# Patient Record
Sex: Male | Born: 1970
Health system: Midwestern US, Community
[De-identification: ages and names within clinical notes are randomized; demographics above are authoritative.]

## PROBLEM LIST (undated history)

## (undated) DIAGNOSIS — F319 Bipolar disorder, unspecified: Secondary | ICD-10-CM

## (undated) DIAGNOSIS — F209 Schizophrenia, unspecified: Secondary | ICD-10-CM

## (undated) DIAGNOSIS — F329 Major depressive disorder, single episode, unspecified: Secondary | ICD-10-CM

## (undated) DIAGNOSIS — I10 Essential (primary) hypertension: Secondary | ICD-10-CM

## (undated) DIAGNOSIS — E7800 Pure hypercholesterolemia, unspecified: Secondary | ICD-10-CM

## (undated) DIAGNOSIS — F32A Depression, unspecified: Secondary | ICD-10-CM

## (undated) DIAGNOSIS — E785 Hyperlipidemia, unspecified: Secondary | ICD-10-CM

## (undated) HISTORY — PX: NO PAST SURGERIES: SHX2092

## (undated) MED FILL — CLONIDINE HCL 0.1MG TABS: 0.1 MG | 7 days supply | Qty: 14 | Fill #0 | Status: AC

## (undated) MED FILL — traZODone 50MG TAB: 50 MG | 14 days supply | Qty: 14 | Fill #0 | Status: AC

---

## 2017-06-05 ENCOUNTER — Emergency Department (HOSPITAL_COMMUNITY)
Admission: EM | Admit: 2017-06-05 | Discharge: 2017-06-06 | Disposition: A | Discharge status: Home / Self Care | Payer: Self-pay | Attending: Emergency Medicine | Admitting: Emergency Medicine

## 2017-06-05 ENCOUNTER — Other Ambulatory Visit: Payer: Self-pay

## 2017-06-05 ENCOUNTER — Encounter (HOSPITAL_COMMUNITY): Payer: Self-pay | Admitting: Emergency Medicine

## 2017-06-05 DIAGNOSIS — F1414 Cocaine abuse with cocaine-induced mood disorder: Secondary | ICD-10-CM | POA: Diagnosis present

## 2017-06-05 DIAGNOSIS — R45851 Suicidal ideations: Secondary | ICD-10-CM | POA: Insufficient documentation

## 2017-06-05 DIAGNOSIS — F1910 Other psychoactive substance abuse, uncomplicated: Secondary | ICD-10-CM | POA: Insufficient documentation

## 2017-06-05 LAB — CBC
HEMATOCRIT: 40.1 % (ref 39.0–52.0)
Hemoglobin: 13.7 g/dL (ref 13.0–17.0)
MCH: 30.2 pg (ref 26.0–34.0)
MCHC: 34.2 g/dL (ref 30.0–36.0)
MCV: 88.5 fL (ref 78.0–100.0)
Platelets: 202 10*3/uL (ref 150–400)
RBC: 4.53 MIL/uL (ref 4.22–5.81)
RDW: 12.7 % (ref 11.5–15.5)
WBC: 7.7 10*3/uL (ref 4.0–10.5)

## 2017-06-05 LAB — COMPREHENSIVE METABOLIC PANEL
ALBUMIN: 4.1 g/dL (ref 3.5–5.0)
ALK PHOS: 79 U/L (ref 38–126)
ALT: 34 U/L (ref 17–63)
ANION GAP: 15 (ref 5–15)
AST: 35 U/L (ref 15–41)
BUN: 12 mg/dL (ref 6–20)
CALCIUM: 9.2 mg/dL (ref 8.9–10.3)
CO2: 23 mmol/L (ref 22–32)
Chloride: 105 mmol/L (ref 101–111)
Creatinine, Ser: 1.09 mg/dL (ref 0.61–1.24)
GFR calc Af Amer: >60 mL/min (ref >60)
GFR calc non Af Amer: >60 mL/min (ref >60)
GLUCOSE: 103 mg/dL — AB (ref 65–99)
Potassium: 3.8 mmol/L (ref 3.5–5.1)
SODIUM: 143 mmol/L (ref 135–145)
Total Bilirubin: 0.7 mg/dL (ref 0.3–1.2)
Total Protein: 7.9 g/dL (ref 6.5–8.1)

## 2017-06-05 LAB — ETHANOL: Alcohol, Ethyl (B): 37 mg/dL — ABNORMAL HIGH (ref <10)

## 2017-06-05 LAB — RAPID URINE DRUG SCREEN, HOSP PERFORMED
Amphetamines: NOT DETECTED
BARBITURATES: NOT DETECTED
BENZODIAZEPINES: NOT DETECTED
COCAINE: POSITIVE — AB
Opiates: NOT DETECTED
TETRAHYDROCANNABINOL: NOT DETECTED

## 2017-06-05 LAB — SALICYLATE LEVEL: Salicylate Lvl: <7 mg/dL (ref 2.8–30.0)

## 2017-06-05 LAB — ACETAMINOPHEN LEVEL

## 2017-06-05 MED ORDER — ZOLPIDEM TARTRATE 5 MG PO TABS: 5.0000 mg | ORAL_TABLET | Freq: Every evening | ORAL | Status: DC | PRN

## 2017-06-05 MED ORDER — LORAZEPAM 1 MG PO TABS: 0.0000 mg | ORAL_TABLET | Freq: Two times a day (BID) | ORAL | Status: DC

## 2017-06-05 MED ORDER — SERTRALINE HCL 50 MG PO TABS
100.0000 mg | ORAL_TABLET | Freq: Every day | ORAL | Status: DC
  Administered 2017-06-05: 100 mg via ORAL
  Filled 2017-06-05: qty 2

## 2017-06-05 MED ORDER — THIAMINE HCL 100 MG/ML IJ SOLN: 100.0000 mg | Freq: Every day | INTRAMUSCULAR | Status: DC

## 2017-06-05 MED ORDER — LORAZEPAM 2 MG/ML IJ SOLN: 0.0000 mg | Freq: Two times a day (BID) | INTRAMUSCULAR | Status: DC

## 2017-06-05 MED ORDER — HYDROXYZINE HCL 25 MG PO TABS: 50.0000 mg | ORAL_TABLET | Freq: Three times a day (TID) | ORAL | Status: DC | PRN

## 2017-06-05 MED ORDER — ACETAMINOPHEN 325 MG PO TABS: 650.0000 mg | ORAL_TABLET | ORAL | Status: DC | PRN

## 2017-06-05 MED ORDER — VITAMIN B-1 100 MG PO TABS
100.0000 mg | ORAL_TABLET | Freq: Every day | ORAL | Status: DC
  Administered 2017-06-05 – 2017-06-06 (×2): 100 mg via ORAL
  Filled 2017-06-05 (×2): qty 1

## 2017-06-05 MED ORDER — LORAZEPAM 2 MG/ML IJ SOLN: 0.0000 mg | Freq: Four times a day (QID) | INTRAMUSCULAR | Status: DC

## 2017-06-05 MED ORDER — LORAZEPAM 1 MG PO TABS
0.0000 mg | ORAL_TABLET | Freq: Four times a day (QID) | ORAL | Status: DC
  Administered 2017-06-05: 2 mg via ORAL
  Filled 2017-06-05: qty 2

## 2017-06-05 NOTE — ED Notes (Signed)
Patient endorse SI with out a plan and AH telling him people are out to get him. Patient denies HI and VH. Plan of care discussed. Encouragement and support provided and safety maintain. Q 15 min safety checks in place and video monitoring.

## 2017-06-05 NOTE — ED Triage Notes (Signed)
Pt reports feeling suicidal without a plan and has been taking narcotics daily for the last week and feels unsafe.

## 2017-06-05 NOTE — BH Assessment (Addendum)
Tele Assessment Note   Patient Name: Craig Hogan MRN: [id number] Referring Physician: Delbert Phenix, MD Location of Patient: [facility] ED, [facility] Location of Provider: Behavioral Health TTS Department  [name] is an 47 y.o. single male who presents unaccompanied to [facility] reporting symptoms of depression, psychosis and substance abuse. Pt reports he has a history of depression and was released from jail in [place] after being incarcerated for three days due to missing child support payments. Pt says he came to [facility] and was in distress so he contacted law enforcement who referred Pt to [facility]. Pt report he relapsed on alcohol and cocaine. ED notes state Pt reported he is abusing narcotics but Pt reports he is only using alcohol and cocaine; Pt's urine drug screen is positive for cocaine and blood alcohol level is 37. He says he is paranoid and believes he is hearing people's thoughts in his head. He also says "everywhere I go people know me and are watching me." He says the voices sometimes tell him people are trying to harm him. He denies visual hallucinations. He reports current suicidal ideation with no plan and no intent. Pt denies any history of suicide attempts. He denies any history of intentional self-injurious behaviors. He reports thoughts of harming others with no plan, victim or intent. He says these thoughts of harming others are in response to believe people want to harm him. Pt acknowledges symptoms including social withdrawal, fatigue, irritability, decreased concentration, decreased sleep and feelings of guilt and hopelessness.   Pt identifies his mental health symptoms as his primary stressor. He says he has been unable to secure employment and he has financial stress. He reports he is currently homeless. He identifies his mother as his only support. Pt reports he has a seventeen-year-old daughter who is in the care of her mother. Pt says he has a court  date in two months for back child support. He denies any history of abuse or trauma.  Pt reports he has been psychiatrically hospitalized twice before. He says he was last psychiatrically hospitalized approximately six weeks ago at [facility]. He says he is prescribed hydroxyzine and Zoloft at [facility] and he currently does not have an outpatient provider.  Pt is dressed in hospital scrubs, alert and oriented x4. Pt speaks in a clear tone, at moderate volume and normal pace. Motor behavior appears normal. Eye contact is good. Pt's mood is depressed and anxious; affect is anxious. Thought process is coherent and relevant. Pt was polite and cooperative throughout assessment. He says he is willing to sign voluntarily into a psychiatric facility.   Diagnosis:  F33.3 Major Depressive Disorder, Recurrent, Severe With Psychotic Features F10.20 Alcohol Use Disorder F14.20 Cocaine Use Disorder  Past Medical History: History reviewed. No pertinent past medical history.  History reviewed. No pertinent surgical history.  Family History: History reviewed. No pertinent family history.  Social History:  reports that he has been smoking cigarettes.  He has been smoking about 0.50 packs per day. He has never used smokeless tobacco. He reports that he drinks alcohol. He reports that he has current or past drug history. Drug: Cocaine.  Additional Social History:  Alcohol / Drug Use Pain Medications: Pt denies use Prescriptions: See MAR Over the Counter: See MAR History of alcohol / drug use?: Yes Longest period of sobriety (when/how long): Six months Negative Consequences of Use: Personal relationships, Financial, Work / School Withdrawal Symptoms: (Pt denies) Substance #1 Name of Substance 1: Alcohol 1 -  Age of First Use: Adolescent 1 - Amount (size/oz): 6-12 cans of beer 1 - Frequency: Daily 1 - Duration: Ongoing for years 1 - Last Use / Amount: 06/05/17 Substance #2 Name of Substance 2:  Cocaine (crack) 2 - Age of First Use: 43 2 - Amount (size/oz): Approximately 1 gram 2 - Frequency: Daily when available 2 - Duration: Four years 2 - Last Use / Amount: 06/05/17  CIWA: CIWA-Ar BP: 135/80 Pulse Rate: 81 Nausea and Vomiting: no nausea and no vomiting Tactile Disturbances: mild itching, pins and needles, burning or numbness Tremor: two Auditory Disturbances: very mild harshness or ability to frighten Paroxysmal Sweats: barely perceptible sweating, palms moist Visual Disturbances: very mild sensitivity Anxiety: two Headache, Fullness in Head: none present Agitation: somewhat more than normal activity Orientation and Clouding of Sensorium: cannot do serial additions or is uncertain about date CIWA-Ar Total: 11 COWS:    Allergies: No Known Allergies  Home Medications:  (Not in a hospital admission)  OB/GYN Status:  No LMP for male patient.  General Assessment Data [facility] of [facility]: [facility] TTS Assessment: In system Is this a Tele or Face-to-Face Assessment?: Tele Assessment Is this an Initial Assessment or a Re-assessment for this encounter?: Initial Assessment Marital status: Single Maiden name: NA Is patient pregnant?: No Pregnancy Status: No Living Arrangements: Other (Comment)(Homeless) Can pt return to current living arrangement?: Yes Admission Status: Voluntary Is patient capable of signing voluntary admission?: Yes Referral Source: Self/Family/Friend Insurance type: Self-pay     Crisis Care Plan Living Arrangements: Other (Comment)(Homeless) Legal Guardian: Other:(Self) Name of Psychiatrist: None Name of Therapist: None  Education Status Is patient currently in school?: No Is the patient employed, unemployed or receiving disability?: Unemployed  Risk to self with the past 6 months Suicidal Ideation: Yes-Currently Present Has patient been a risk to self within the past 6 months prior to admission? : Yes Suicidal Intent: No Has patient  had any suicidal intent within the past 6 months prior to admission? : No Is patient at risk for suicide?: Yes Suicidal Plan?: No Has patient had any suicidal plan within the past 6 months prior to admission? : No Access to Means: No What has been your use of drugs/alcohol within the last 12 months?: Pt reports using alcohol and crack daily when available Previous Attempts/Gestures: No How many times?: 0 Other Self Harm Risks: None Triggers for Past Attempts: None known Intentional Self Injurious Behavior: None Family Suicide History: No Recent stressful life event(s): Legal Issues, Financial Problems, Job Loss, Other (Comment)(Homeless) Persecutory voices/beliefs?: Yes Depression: Yes Depression Symptoms: Despondent, Insomnia, Isolating, Fatigue, Guilt, Loss of interest in usual pleasures, Feeling angry/irritable Substance abuse history and/or treatment for substance abuse?: Yes Suicide prevention information given to non-admitted patients: Not applicable  Risk to Others within the past 6 months Homicidal Ideation: Yes-Currently Present Does patient have any lifetime risk of violence toward others beyond the six months prior to admission? : No Thoughts of Harm to Others: Yes-Currently Present Comment - Thoughts of Harm to Others: Pt reports vague thoughts of harming unspecified people Current Homicidal Intent: No Current Homicidal Plan: No Access to Homicidal Means: No Identified Victim: No specified victim History of harm to others?: No Assessment of Violence: None Noted Violent Behavior Description: Pt denies history of violence Does patient have access to weapons?: No Criminal Charges Pending?: No Does patient have a court date: Yes Court Date: 08/05/17 Is patient on probation?: No  Psychosis Hallucinations: Auditory(Pt reports hearing other people's thoughts) Delusions: Persecutory(Pt  believes people know him and are watching him)  Mental Status  Report Appearance/Hygiene: In scrubs Eye Contact: Good Motor Activity: Unremarkable, Freedom of movement Speech: Logical/coherent Level of Consciousness: Alert Mood: Depressed, Anxious Affect: Anxious Anxiety Level: Moderate Thought Processes: Coherent, Relevant Judgement: Impaired Orientation: Person, Place, Time, Situation, Appropriate for developmental age Obsessive Compulsive Thoughts/Behaviors: None  Cognitive Functioning Concentration: Fair Memory: Recent Intact, Remote Intact Is patient IDD: No Is patient DD?: No Insight: Fair Impulse Control: Fair Appetite: Good Have you had any weight changes? : No Change Sleep: Decreased Total Hours of Sleep: 6 Vegetative Symptoms: None  ADLScreening [facility] Assessment Services) Patient's cognitive ability adequate to safely complete daily activities?: Yes Patient able to express need for assistance with ADLs?: Yes Independently performs ADLs?: Yes (appropriate for developmental age)  Prior Inpatient Therapy Prior Inpatient Therapy: Yes Prior Therapy Dates: 03/2017 [facility] Reason for Treatment: Psychosis and substance abuse  Prior Outpatient Therapy Prior Outpatient Therapy: No Does patient have an ACCT team?: No Does patient have Intensive In-House Services?  : No Does patient have [facility] services? : No Does patient have P4CC services?: No  ADL Screening (condition at time of admission) Patient's cognitive ability adequate to safely complete daily activities?: Yes Is the patient deaf or have difficulty hearing?: No Does the patient have difficulty seeing, even when wearing glasses/contacts?: No Does the patient have difficulty concentrating, remembering, or making decisions?: No Patient able to express need for assistance with ADLs?: Yes Does the patient have difficulty dressing or bathing?: No Independently performs ADLs?: Yes (appropriate for developmental age) Does the patient  have difficulty walking or climbing stairs?: No Weakness of Legs: None Weakness of Arms/Hands: None  Home Assistive Devices/Equipment Home Assistive Devices/Equipment: None    Abuse/Neglect Assessment (Assessment to be complete while patient is alone) Abuse/Neglect Assessment Can Be Completed: Yes Physical Abuse: Denies Verbal Abuse: Denies Sexual Abuse: Denies Exploitation of patient/patient's resources: Denies Self-Neglect: Denies     Merchant navy officerAdvance Directives (For Healthcare) Does Patient Have a Medical Advance Directive?: No Would patient like information on creating a medical advance directive?: No - Patient declined          Disposition: Gave clinical report to Nira ConnJason Berry, NP who recommended Pt be observed overnight for safety and stabilization and evaluated by psychiatry in the morning. Notified Dr. Delbert PhenixMartha Mabe and Tyrell Antonioashell McLean, RN of recommendation.  Disposition Initial Assessment Completed for this Encounter: Yes Patient referred to: Other (Comment)  This service was provided via telemedicine using a 2-way, interactive audio and video technology.  Names of all persons participating in this telemedicine service and their role in this encounter. Name: Dyanne Iha'Angelo Sanko Role: Patient  Name: Shela CommonsFord Jaiceon Collister Jr, [place]PC Role: TTS counselor         Harlin RainFord Ellis Patsy BaltimoreWarrick Jr, [facility], [facility], [facility] Triage Specialist [phone]  Pamalee LeydenWarrick Jr, Starlyn Droge Ellis 06/05/2017 10:51 PM

## 2017-06-05 NOTE — ED Provider Notes (Signed)
[facility]-EMERGENCY DEPT Provider Note   CSN: [id number] Arrival date & time: 06/05/17  2033     History   Chief Complaint Chief Complaint  Patient presents with  . Suicidal    HPI Craig Hogan is a 47 y.o. male.  HPI  Patient presents with complaint of feeling suicidal.  He is vague when talking about this but states he has been "going through a lot of things".  He states he has been feeling suicidal for the past 2 months.   He does not have a specific plan.  He does state that he feels unsafe.  He states he has been taking narcotics and also states he used cocaine earlier today.  No recent fever, cough, abdominal pain or vomtiing.  There are no other associated systemic symptoms, there are no other alleviating or modifying factors.   History reviewed. No pertinent past medical history.  There are no active problems to display for this patient.   History reviewed. No pertinent surgical history.      Home Medications    Prior to Admission medications   Medication Sig Start Date End Date Taking? Authorizing Provider  hydrOXYzine (ATARAX/VISTARIL) 50 MG tablet Take 50 mg by mouth every 8 (eight) hours as needed for anxiety.   Yes [provider]  Multiple Vitamin (MULTIVITAMIN WITH MINERALS) TABS tablet Take 1 tablet by mouth daily.   Yes [provider]  sertraline (ZOLOFT) 100 MG tablet Take 100 mg by mouth at bedtime.   Yes [provider]    Family History History reviewed. No pertinent family history.  Social History Social History   Tobacco Use  . Smoking status: Current Every Day Smoker    Packs/day: 0.50    Types: Cigarettes  . Smokeless tobacco: Never Used  Substance Use Topics  . Alcohol use: Yes    Comment: 6-12 pack a day  . Drug use: Yes    Types: Cocaine     Allergies   Patient has no known allergies.   Review of Systems Review of Systems  ROS reviewed and all otherwise negative except  for mentioned in HPI   Physical Exam Updated Vital Signs BP 135/80   Pulse 81   Temp 99.2 F (37.3 C)   Resp 18   Ht 6\' 4"  (1.93 m)   Wt 111.1 kg (245 lb)   SpO2 97%   BMI 29.82 kg/m  Vitals reviewed Physical Exam  Physical Examination: General appearance - alert, well appearing, and in no distress Mental status - alert, oriented to person, place, and time Eyes - no conjunctival injection, no scleral icterus Chest - clear to auscultation, no wheezes, rales or rhonchi, symmetric air entry Heart - normal rate, regular rhythm, normal S1, S2, no murmurs, rubs, clicks or gallops Abdomen - soft, nontender, nondistended, no masses or organomegaly Neurological - alert, oriented, normal speech Extremities - peripheral pulses normal, no pedal edema, no clubbing or cyanosis Skin - normal coloration and turgor, no rashes Psych- flat affect, calm and cooperative   ED Treatments / Results  Labs (all labs ordered are listed, but only abnormal results are displayed) Labs Reviewed  COMPREHENSIVE METABOLIC PANEL - Abnormal; Notable for the following components:      Result Value   Glucose, Bld 103 (*)    All other components within normal limits  ETHANOL - Abnormal; Notable for the following components:   Alcohol, Ethyl (B) 37 (*)    All other components within normal limits  ACETAMINOPHEN LEVEL - Abnormal; Notable for the following components:   Acetaminophen (Tylenol), Serum <10 (*)    All other components within normal limits  RAPID URINE DRUG SCREEN, HOSP PERFORMED - Abnormal; Notable for the following components:   Cocaine POSITIVE (*)    All other components within normal limits  SALICYLATE LEVEL  CBC    EKG None  ED ECG REPORT   Date: 06/05/2017  Rate: 80  Rhythm: normal sinus rhythm  QRS Axis: normal  Intervals: normal  ST/T Wave abnormalities: normal  Conduction Disutrbances:none  Narrative Interpretation:   Old EKG Reviewed: none available  EKG link not  available in epic for interpetation in muse   Radiology No results found.  Procedures Procedures (including critical care time)  Medications Ordered in ED Medications  LORazepam (ATIVAN) injection 0-4 mg ( Intravenous See Alternative 06/05/17 2207)    Or  LORazepam (ATIVAN) tablet 0-4 mg (2 mg Oral Given 06/05/17 2207)  LORazepam (ATIVAN) injection 0-4 mg (has no administration in time range)    Or  LORazepam (ATIVAN) tablet 0-4 mg (has no administration in time range)  thiamine (VITAMIN B-1) tablet 100 mg (100 mg Oral Given 06/05/17 2207)    Or  thiamine (B-1) injection 100 mg ( Intravenous See Alternative 06/05/17 2207)  acetaminophen (TYLENOL) tablet 650 mg (has no administration in time range)  zolpidem (AMBIEN) tablet 5 mg (has no administration in time range)  hydrOXYzine (ATARAX/VISTARIL) tablet 50 mg (has no administration in time range)  sertraline (ZOLOFT) tablet 100 mg (100 mg Oral Given 06/05/17 2311)     Initial Impression / Assessment and Plan / ED Course  I have reviewed the triage vital signs and the nursing notes.  Pertinent labs & imaging results that were available during my care of the patient were reviewed by me and considered in my medical decision making (see chart for details).    10:46 PM pt has been evaluated by TTS and they plan for overnight observation with re-evaluation in the morning.    pysch holding orders have been written and home meds written.    Final Clinical Impressions(s) / ED Diagnoses   Final diagnoses:  Suicidal ideation  Polysubstance abuse [facility])    ED Discharge Orders    None       Phillis Haggis, MD 06/05/17 2335

## 2017-06-05 NOTE — ED Notes (Signed)
TTS in progress 

## 2017-06-05 NOTE — ED Notes (Signed)
Bed: [facility] Expected date:  Expected time:  Means of arrival:  Comments: 

## 2017-06-06 DIAGNOSIS — F1414 Cocaine abuse with cocaine-induced mood disorder: Secondary | ICD-10-CM

## 2017-06-06 DIAGNOSIS — R45851 Suicidal ideations: Secondary | ICD-10-CM

## 2017-06-06 NOTE — ED Notes (Signed)
AVS, and prescription provided and reviewed. Verbalized understanding of DC instructions and prescriptions. Denied SI/HI/AVH. Denied pain. Offered no questions or concerns. Escorted off the unit, belongings returned and directed to front exit by MHT. Pt left in no acute distress 

## 2017-06-06 NOTE — BH Assessment (Signed)
[facility] Assessment Progress Note     Per Dr. Jannifer FranklinAkintayo, patient has been psychiatrically cleared for discharge [facility].

## 2017-06-06 NOTE — Consult Note (Addendum)
[place] [facility] Consult   Reason for Consult:  Cocaine abuse with suicidal ideations Referring Physician:  EDP Patient Identification: Deforest Maiden MRN:  [id number] Principal Diagnosis: Cocaine abuse with cocaine-induced mood disorder [facility]) Diagnosis:   Patient Active Problem List   Diagnosis Date Noted  . Cocaine abuse with cocaine-induced mood disorder [facility]) [F14.14] 06/06/2017    Priority: High    Total Time spent with patient: 45 minutes  Subjective:   Craig Hogan is a 47 y.o. male patient does not warrant admission.  HPI:  47 yo male who presented to the ED with cocaine abuse and suicidal ideations.  Today, he reports he feels much better with no suicidal/homicidal ideations, hallucinations, or withdrawal symptoms.  Peer support consult placed, stable for discharge.  Past Psychiatric History: substance abuse   Risk to Self: None Risk to Others: NOne Prior Inpatient Therapy: Prior Inpatient Therapy: Yes Prior Therapy Dates: 03/2017 Prior Therapy Facilty/Provider(s): [place] Reason for Treatment: Psychosis and substance abuse Prior Outpatient Therapy: Prior Outpatient Therapy: No Does patient have an ACCT team?: No Does patient have Intensive In-House Services?  : No Does patient have [facility] services? : No Does patient have P4CC services?: No  Past Medical History: History reviewed. No pertinent past medical history. History reviewed. No pertinent surgical history. Family History: History reviewed. No pertinent family history. Family Psychiatric  History: none Social History:  Social History   Substance and Sexual Activity  Alcohol Use Yes   Comment: 6-12 pack a day     Social History   Substance and Sexual Activity  Drug Use Yes  . Types: Cocaine    Social History   Socioeconomic History  . Marital status: Single    Spouse name: Not on file  . Number of children: Not on file  . Years of education: Not on file  . Highest  education level: Not on file  Occupational History  . Not on file  Social Needs  . Financial resource strain: Not on file  . Food insecurity:    Worry: Not on file    Inability: Not on file  . Transportation needs:    Medical: Not on file    Non-medical: Not on file  Tobacco Use  . Smoking status: Current Every Day Smoker    Packs/day: 0.50    Types: Cigarettes  . Smokeless tobacco: Never Used  Substance and Sexual Activity  . Alcohol use: Yes    Comment: 6-12 pack a day  . Drug use: Yes    Types: Cocaine  . Sexual activity: Not on file  Lifestyle  . Physical activity:    Days per week: Not on file    Minutes per session: Not on file  . Stress: Not on file  Relationships  . Social connections:    Talks on phone: Not on file    Gets together: Not on file    Attends religious service: Not on file    Active member of club or organization: Not on file    Attends meetings of clubs or organizations: Not on file    Relationship status: Not on file  Other Topics Concern  . Not on file  Social History Narrative  . Not on file   Additional Social History:    Allergies:  No Known Allergies  Labs:  Results for orders placed or performed during the hospital encounter of 06/05/17 (from the past 48 hour(s))  Comprehensive metabolic panel     Status: Abnormal   Collection Time:  06/05/17  8:53 PM  Result Value Ref Range   Sodium 143 135 - 145 mmol/L   Potassium 3.8 3.5 - 5.1 mmol/L   Chloride 105 101 - 111 mmol/L   CO2 23 22 - 32 mmol/L   Glucose, Bld 103 (H) 65 - 99 mg/dL   BUN 12 6 - 20 mg/dL   Creatinine, Ser 1.09 0.61 - 1.24 mg/dL   Calcium 9.2 8.9 - 10.3 mg/dL   Total Protein 7.9 6.5 - 8.1 g/dL   Albumin 4.1 3.5 - 5.0 g/dL   AST 35 15 - 41 U/L   ALT 34 17 - 63 U/L   Alkaline Phosphatase 79 38 - 126 U/L   Total Bilirubin 0.7 0.3 - 1.2 mg/dL   GFR calc non Af Amer >60 >60 mL/min   GFR calc Af Amer >60 >60 mL/min    Comment: (NOTE) The eGFR has been calculated  using the CKD EPI equation. This calculation has not been validated in all clinical situations. eGFR's persistently <60 mL/min signify possible Chronic Kidney Disease.    Anion gap 15 5 - 15    Comment: Performed at [facility], [place] [address], [place], [place] [address]  Ethanol     Status: Abnormal   Collection Time: 06/05/17  8:53 PM  Result Value Ref Range   Alcohol, Ethyl (B) 37 (H) <10 mg/dL    Comment:        LOWEST DETECTABLE LIMIT FOR SERUM ALCOHOL IS 10 mg/dL FOR MEDICAL PURPOSES ONLY Performed at [place] [address] [place], [place] [address]   Salicylate level     Status: None   Collection Time: 06/05/17  8:53 PM  Result Value Ref Range   Salicylate Lvl <3.2 2.8 - 30.0 mg/dL    Comment: Performed at [facility], [place] [address], [place], [place] [address]  Acetaminophen level     Status: Abnormal   Collection Time: 06/05/17  8:53 PM  Result Value Ref Range   Acetaminophen (Tylenol), Serum <10 (L) 10 - 30 ug/mL    Comment:        THERAPEUTIC CONCENTRATIONS VARY SIGNIFICANTLY. A RANGE OF 10-30 ug/mL MAY BE AN EFFECTIVE CONCENTRATION FOR MANY PATIENTS. HOWEVER, SOME ARE BEST TREATED AT CONCENTRATIONS OUTSIDE THIS RANGE. ACETAMINOPHEN CONCENTRATIONS >150 ug/mL AT 4 HOURS AFTER INGESTION AND >50 ug/mL AT 12 HOURS AFTER INGESTION ARE OFTEN ASSOCIATED WITH TOXIC REACTIONS. Performed at [facility], [place] [address], [place], [place] [address]   cbc     Status: None   Collection Time: 06/05/17  8:53 PM  Result Value Ref Range   WBC 7.7 4.0 - 10.5 K/uL   RBC 4.53 4.22 - 5.81 MIL/uL   Hemoglobin 13.7 13.0 - 17.0 g/dL   HCT 40.1 39.0 - 52.0 %   MCV 88.5 78.0 - 100.0 fL   MCH 30.2 26.0 - 34.0 pg   MCHC 34.2 30.0 - 36.0 g/dL   RDW 12.7 11.5 - 15.5 %   Platelets 202 150 - 400 K/uL    Comment: Performed at [facility], [place] [address], [place], [place] [address]   Rapid urine drug screen (hospital performed)     Status: Abnormal   Collection Time: 06/05/17  8:53 PM  Result Value Ref Range   Opiates NONE DETECTED NONE DETECTED   Cocaine POSITIVE (A) NONE DETECTED   Benzodiazepines NONE DETECTED NONE DETECTED   Amphetamines NONE DETECTED NONE DETECTED   Tetrahydrocannabinol NONE  DETECTED NONE DETECTED   Barbiturates NONE DETECTED NONE DETECTED    Comment: (NOTE) DRUG SCREEN FOR MEDICAL PURPOSES ONLY.  IF CONFIRMATION IS NEEDED FOR ANY PURPOSE, NOTIFY LAB WITHIN 5 DAYS. LOWEST DETECTABLE LIMITS FOR URINE DRUG SCREEN Drug Class                     Cutoff (ng/mL) Amphetamine and metabolites    1000 Barbiturate and metabolites    200 Benzodiazepine                 017 Tricyclics and metabolites     300 Opiates and metabolites        300 Cocaine and metabolites        300 THC                            50 Performed at [facility], [place] [address], [place], [place] [address]     Current Facility-Administered Medications  Medication Dose Route Frequency Provider Last Rate Last Dose  . acetaminophen (TYLENOL) tablet 650 mg  650 mg Oral Q4H PRN Mabe, Forbes Cellar, MD      . hydrOXYzine (ATARAX/VISTARIL) tablet 50 mg  50 mg Oral Q8H PRN Mabe, Forbes Cellar, MD      . sertraline (ZOLOFT) tablet 100 mg  100 mg Oral QHS Pixie Casino, MD   100 mg at 06/05/17 2311  . thiamine (VITAMIN B-1) tablet 100 mg  100 mg Oral Daily Mabe, Forbes Cellar, MD   100 mg at 06/06/17 8527   Or  . thiamine (B-1) injection 100 mg  100 mg Intravenous Daily Mabe, Forbes Cellar, MD       Current Outpatient Medications  Medication Sig Dispense Refill  . hydrOXYzine (ATARAX/VISTARIL) 50 MG tablet Take 50 mg by mouth every 8 (eight) hours as needed for anxiety.    . Multiple Vitamin (MULTIVITAMIN WITH MINERALS) TABS tablet Take 1 tablet by mouth daily.    . sertraline (ZOLOFT) 100 MG tablet Take 100 mg by mouth at bedtime.      Musculoskeletal: Strength & Muscle Tone:  within normal limits Gait & Station: normal Patient leans: N/A  Psychiatric Specialty Exam: Physical Exam  Constitutional: He is oriented to person, place, and time. He appears well-developed and well-nourished.  Neck: Normal range of motion.  Respiratory: Effort normal.  Musculoskeletal: Normal range of motion.  Neurological: He is alert and oriented to person, place, and time.  Psychiatric: He has a normal mood and affect. His speech is normal and behavior is normal. Judgment and thought content normal. Cognition and memory are normal.    Review of Systems  Psychiatric/Behavioral: Positive for substance abuse.  All other systems reviewed and are negative.   Blood pressure (!) 108/59, pulse 75, temperature 98.8 F (37.1 C), temperature source Oral, resp. rate 18, height '6\' 4"'  (1.93 m), weight 111.1 kg (245 lb), SpO2 99 %.Body mass index is 29.82 kg/m.  General Appearance: Casual  Eye Contact:  Good  Speech:  Normal Rate  Volume:  Normal  Mood:  Euthymic  Affect:  Congruent  Thought Process:  Coherent and Descriptions of Associations: Intact  Orientation:  Full (Time, Place, and Person)  Thought Content:  WDL and Logical  Suicidal Thoughts:  No  Homicidal Thoughts:  No  Memory:  Immediate;   Good Recent;   Good Remote;   Good  Judgement:  Fair  Insight:  Fair  Psychomotor Activity:  Normal  Concentration:  Concentration: Good and Attention Span: Good  Recall:  Good  Fund of Knowledge:  Fair  Language:  Good  Akathisia:  No  Handed:  Right  AIMS (if indicated):     Assets:  Leisure Time Physical Health Resilience  ADL's:  Intact  Cognition:  WNL  Sleep:        Treatment Plan Summary: Daily contact with patient to assess and evaluate symptoms and progress in treatment, Medication management and Plan cocaine abuse with cocaine induced mood disorder:  -Crisis stabilization -Medication management:  Restarted his medical medications along with hydroxyzine 50 mg every  8 hours PRN anxiety and sertraline 100 mg daily for depression -Individual counseling  Disposition: No evidence of imminent risk to self or others at present.    Waylan Boga, NP 06/06/2017 10:23 AM  Patient seen face-to-face for psychiatric evaluation, chart reviewed and case discussed with the physician extender and developed treatment plan. Reviewed the information documented and agree with the treatment plan. Corena Pilgrim, MD

## 2017-06-06 NOTE — [facility] Suicide Risk Assessment (Signed)
Suicide Risk Assessment  Discharge Assessment   [facility]   Principal Problem: Cocaine abuse with cocaine-induced mood disorder [facility](HCC) Discharge Diagnoses:  Patient Active Problem List   Diagnosis Date Noted  . Cocaine abuse with cocaine-induced mood disorder [facility](HCC) [F14.14] 06/06/2017    Priority: High    Total Time spent with patient: 45 minutes  Musculoskeletal: Strength & Muscle Tone: within normal limits Gait & Station: normal Patient leans: N/A  Psychiatric Specialty Exam: Physical Exam  Constitutional: He is oriented to person, place, and time. He appears well-developed and well-nourished.  Neck: Normal range of motion.  Respiratory: Effort normal.  Musculoskeletal: Normal range of motion.  Neurological: He is alert and oriented to person, place, and time.  Psychiatric: He has a normal mood and affect. His speech is normal and behavior is normal. Judgment and thought content normal. Cognition and memory are normal.    Review of Systems  Psychiatric/Behavioral: Positive for substance abuse.  All other systems reviewed and are negative.   Blood pressure (!) 108/59, pulse 75, temperature 98.8 F (37.1 C), temperature source Oral, resp. rate 18, height 6\' 4"  (1.93 m), weight 111.1 kg (245 lb), SpO2 99 %.Body mass index is 29.82 kg/m.  General Appearance: Casual  Eye Contact:  Good  Speech:  Normal Rate  Volume:  Normal  Mood:  Euthymic  Affect:  Congruent  Thought Process:  Coherent and Descriptions of Associations: Intact  Orientation:  Full (Time, Place, and Person)  Thought Content:  WDL and Logical  Suicidal Thoughts:  No  Homicidal Thoughts:  No  Memory:  Immediate;   Good Recent;   Good Remote;   Good  Judgement:  Fair  Insight:  Fair  Psychomotor Activity:  Normal  Concentration:  Concentration: Good and Attention Span: Good  Recall:  Good  Fund of Knowledge:  Fair  Language:  Good  Akathisia:  No  Handed:  Right  AIMS (if  indicated):     Assets:  Leisure Time Physical Health Resilience  ADL's:  Intact  Cognition:  WNL  Sleep:       Mental Status Per Nursing Assessment::   On Admission:   cocaine abuse with suicidal ideations  Demographic Factors:  Male  Loss Factors: NA  Historical Factors: NA  Risk Reduction Factors:   Sense of responsibility to family, Positive social support and Positive therapeutic relationship  Continued Clinical Symptoms:  None  Cognitive Features That Contribute To Risk:  None    Suicide Risk:  Minimal: No identifiable suicidal ideation.  Patients presenting with no risk factors but with morbid ruminations; may be classified as minimal risk based on the severity of the depressive symptoms    Plan Of Care/Follow-up recommendations:  Activity:  as tolerated Diet:  heart healthy diet  Israella Hubert, NP 06/06/2017, 10:28 AM

## 2017-08-18 ENCOUNTER — Inpatient Hospital Stay
Admit: 2017-08-18 | Discharge: 2017-08-19 | Disposition: A | Discharge status: Home / Self Care | Payer: Self-pay | Attending: Emergency Medicine

## 2017-08-18 ENCOUNTER — Emergency Department: Admit: 2017-08-18 | Payer: Self-pay | Primary: Family Medicine

## 2017-08-18 DIAGNOSIS — F10920 Alcohol use, unspecified with intoxication, uncomplicated: Secondary | ICD-10-CM

## 2017-08-18 LAB — CBC WITH AUTO DIFFERENTIAL
Basophils %: 0 % (ref 0.0–2.0)
Basophils Absolute: 0 10*3/uL (ref 0.0–0.2)
Eosinophils %: 0 % — ABNORMAL LOW (ref 0.5–7.8)
Eosinophils Absolute: 0.1 10*3/uL (ref 0.0–0.8)
Granulocyte Absolute Count: 0 10*3/uL (ref 0.0–0.5)
Hematocrit: 42.8 % (ref 41.1–50.3)
Hemoglobin: 14.4 g/dL (ref 13.6–17.2)
Immature Granulocytes: 0 % (ref 0.0–5.0)
Lymphocytes %: 22 % (ref 13–44)
Lymphocytes Absolute: 2.8 10*3/uL (ref 0.5–4.6)
MCH: 30.9 PG (ref 26.1–32.9)
MCHC: 33.6 g/dL (ref 31.4–35.0)
MCV: 91.8 FL (ref 79.6–97.8)
MPV: 11.2 FL (ref 9.4–12.3)
Monocytes %: 7 % (ref 4.0–12.0)
Monocytes Absolute: 0.9 10*3/uL (ref 0.1–1.3)
NRBC Absolute: 0 10*3/uL (ref 0.0–0.2)
Neutrophils %: 70 % (ref 43–78)
Neutrophils Absolute: 9.2 10*3/uL — ABNORMAL HIGH (ref 1.7–8.2)
Platelets: 260 10*3/uL (ref 150–450)
RBC: 4.66 M/uL (ref 4.23–5.6)
RDW: 14 % (ref 11.9–14.6)
WBC: 13 10*3/uL — ABNORMAL HIGH (ref 4.3–11.1)

## 2017-08-18 LAB — COMPREHENSIVE METABOLIC PANEL
ALT: 38 U/L (ref 12–65)
AST: 37 U/L (ref 15–37)
Albumin/Globulin Ratio: 1 — ABNORMAL LOW (ref 1.2–3.5)
Albumin: 4 g/dL (ref 3.5–5.0)
Alkaline Phosphatase: 94 U/L (ref 50–136)
Anion Gap: 13 mmol/L (ref 7–16)
BUN: 14 MG/DL (ref 6–23)
CO2: 21 mmol/L (ref 21–32)
Calcium: 9.2 MG/DL (ref 8.3–10.4)
Chloride: 106 mmol/L (ref 98–107)
Creatinine: 1.2 MG/DL (ref 0.8–1.5)
EGFR IF NonAfrican American: >60 mL/min/{1.73_m2} (ref >60)
GFR African American: >60 mL/min/{1.73_m2} (ref >60)
Globulin: 4.1 g/dL — ABNORMAL HIGH (ref 2.3–3.5)
Glucose: 93 mg/dL (ref 65–100)
Potassium: 3.8 mmol/L (ref 3.5–5.1)
Sodium: 140 mmol/L (ref 136–145)
Total Bilirubin: 0.3 MG/DL (ref 0.2–1.1)
Total Protein: 8.1 g/dL (ref 6.3–8.2)

## 2017-08-18 LAB — ETHYL ALCOHOL
ALCOHOL(ETHYL),SERUM: 130 MG/DL
Ethyl Alcohol: 130 MG/DL

## 2017-08-18 LAB — METABOLIC PANEL, COMPREHENSIVE
A-G Ratio: 1 — ABNORMAL LOW (ref 1.2–3.5)
ALT (SGPT): 38 U/L (ref 12–65)
AST (SGOT): 37 U/L (ref 15–37)
Albumin: 4 g/dL (ref 3.5–5.0)
Alk. phosphatase: 94 U/L (ref 50–136)
Anion gap: 13 mmol/L (ref 7–16)
BUN: 14 MG/DL (ref 6–23)
Bilirubin, total: 0.3 MG/DL (ref 0.2–1.1)
CO2: 21 mmol/L (ref 21–32)
Calcium: 9.2 MG/DL (ref 8.3–10.4)
Chloride: 106 mmol/L (ref 98–107)
Creatinine: 1.2 MG/DL (ref 0.8–1.5)
GFR est AA: >60 mL/min/{1.73_m2} (ref >60)
GFR est non-AA: >60 mL/min/{1.73_m2} (ref >60)
Globulin: 4.1 g/dL — ABNORMAL HIGH (ref 2.3–3.5)
Glucose: 93 mg/dL (ref 65–100)
Potassium: 3.8 mmol/L (ref 3.5–5.1)
Protein, total: 8.1 g/dL (ref 6.3–8.2)
Sodium: 140 mmol/L (ref 136–145)

## 2017-08-18 LAB — CBC WITH AUTOMATED DIFF
ABS. BASOPHILS: 0 10*3/uL (ref 0.0–0.2)
ABS. EOSINOPHILS: 0.1 10*3/uL (ref 0.0–0.8)
ABS. IMM. GRANS.: 0 10*3/uL (ref 0.0–0.5)
ABS. LYMPHOCYTES: 2.8 10*3/uL (ref 0.5–4.6)
ABS. MONOCYTES: 0.9 10*3/uL (ref 0.1–1.3)
ABS. NEUTROPHILS: 9.2 10*3/uL — ABNORMAL HIGH (ref 1.7–8.2)
ABSOLUTE NRBC: 0 10*3/uL (ref 0.0–0.2)
BASOPHILS: 0 % (ref 0.0–2.0)
EOSINOPHILS: 0 % — ABNORMAL LOW (ref 0.5–7.8)
HCT: 42.8 % (ref 41.1–50.3)
HGB: 14.4 g/dL (ref 13.6–17.2)
IMMATURE GRANULOCYTES: 0 % (ref 0.0–5.0)
LYMPHOCYTES: 22 % (ref 13–44)
MCH: 30.9 PG (ref 26.1–32.9)
MCHC: 33.6 g/dL (ref 31.4–35.0)
MCV: 91.8 FL (ref 79.6–97.8)
MONOCYTES: 7 % (ref 4.0–12.0)
MPV: 11.2 FL (ref 9.4–12.3)
NEUTROPHILS: 70 % (ref 43–78)
PLATELET: 260 10*3/uL (ref 150–450)
RBC: 4.66 M/uL (ref 4.23–5.6)
RDW: 14 % (ref 11.9–14.6)
WBC: 13 10*3/uL — ABNORMAL HIGH (ref 4.3–11.1)

## 2017-08-18 NOTE — ED Notes (Signed)
[place] [facility] [place], pt c/o homelessness, paranoia for 2 month, homicidal, denies suicidal. Has consumed also of crack and alcohol last night. VSS. EMS reports no weapons on person at this time.

## 2017-08-18 NOTE — ED Notes (Signed)
 Pt states he has chest pain and that he is hearing people talk that are out to get him. Pt states he took for pain medications, did some cocaine and drank a 12 pack of beer. Pt states he has homicidal thoughts and states, I haven't figured out how im gonna kill them motha fuckers. Py is alert and oriented. Pt states he has been off of his medications for a couple weeks now.

## 2017-08-18 NOTE — ED Notes (Signed)
Patient complains of chest pain, HI, and paranoia that has been ongoing for a few months. Patient states he decided to come in today due to chest tightness that started this morning. Patient denies any shortness of breath, cough, congestion, dizziness, weakness, nausea or vomiting.

## 2017-08-18 NOTE — ED Provider Notes (Signed)
Patient reports he drank a 12 pack of beer today and used cocaineall night last night.  He states he also took a couple of pain pills today that he believes were oxycodone.  [facility] not too long ago and is disappointed that he has relapsed.  Patient denies suicidal ideation but complained in triage were to EMS of some homicidal ideations.  There is no specific target.  Currently patient is somnolent and would like to sleep.  He awakes easily to voice and is oriented.    The history is provided by the patient.   Mental Health Problem    This is a recurrent problem. Pertinent negatives include no weakness, no hallucinations and no numbness.        History reviewed. No pertinent past medical history.    History reviewed. No pertinent surgical history.      History reviewed. No pertinent family history.    Social History     Socioeconomic History   ??? Marital status: SINGLE     Spouse name: Not on file   ??? Number of children: Not on file   ??? Years of education: Not on file   ??? Highest education level: Not on file   Occupational History   ??? Not on file   Social Needs   ??? Financial resource strain: Not on file   ??? Food insecurity:     Worry: Not on file     Inability: Not on file   ??? Transportation needs:     Medical: Not on file     Non-medical: Not on file   Tobacco Use   ??? Smoking status: Current Every Day Smoker   ??? Smokeless tobacco: Never Used   Substance and Sexual Activity   ??? Alcohol use: Yes   ??? Drug use: Yes     Types: Cocaine   ??? Sexual activity: Not on file   Lifestyle   ??? Physical activity:     Days per week: Not on file     Minutes per session: Not on file   ??? Stress: Not on file   Relationships   ??? Social connections:     Talks on phone: Not on file     Gets together: Not on file     Attends religious service: Not on file     Active member of club or organization: Not on file     Attends meetings of clubs or organizations: Not on file     Relationship status: Not on  file   ??? Intimate partner violence:     Fear of current or ex partner: Not on file     Emotionally abused: Not on file     Physically abused: Not on file     Forced sexual activity: Not on file   Other Topics Concern   ??? Not on file   Social History Narrative   ??? Not on file         ALLERGIES: Patient has no known allergies.    Review of Systems   Constitutional: Negative for fever.   HENT: Positive for congestion. Negative for rhinorrhea.    Respiratory: Negative for cough and shortness of breath.    Cardiovascular: Negative for chest pain.   Gastrointestinal: Negative for abdominal pain, nausea and vomiting.   Endocrine: Negative for polyuria.   Genitourinary: Negative for dysuria and hematuria.   Musculoskeletal: Negative for back pain and neck pain.   Neurological: Negative for weakness  and numbness.   Psychiatric/Behavioral: Positive for dysphoric mood. Negative for hallucinations and suicidal ideas.       Vitals:    08/18/17 1816   BP: 114/68   Pulse: 94   Resp: 16   Temp: 98.3 ??F (36.8 ??C)   SpO2: 94%   Weight: 117.9 kg (260 lb)   Height: 6' 5"  (1.956 m)            Physical Exam   Constitutional: He is oriented to person, place, and time. He appears well-developed and well-nourished.   HENT:   Mouth/Throat: Oropharynx is clear and moist. No oropharyngeal exudate.   Eyes: Pupils are equal, round, and reactive to light. Conjunctivae are normal.   Neck: Neck supple.   Cardiovascular: Normal rate, regular rhythm and normal heart sounds.   Pulmonary/Chest: Effort normal and breath sounds normal.   Abdominal: Soft. Bowel sounds are normal. He exhibits no distension. There is no tenderness. There is no rebound and no guarding.   Musculoskeletal: Normal range of motion. He exhibits no edema or tenderness.   Lymphadenopathy:     He has no cervical adenopathy.   Neurological: He is alert and oriented to person, place, and time. No sensory deficit. He exhibits normal muscle tone.   Skin: Skin is warm and dry.   Nursing  note and vitals reviewed.       MDM  Number of Diagnoses or Management Options  Diagnosis management comments: Allow patient to sober up and sleep.  Check labs, alcohol and UDS.  Reassess when patient is sober.    10:19 PM  Patient snoring and sleeping but arouses easily to voice.  He  DENIES HOMICIDAL OR SUICIDAL IDEATION> He is not psychotic.  Patient requesting alcohol and drug rehab-states [facility]. Has no place to stay here in [place] and usually lives in [place].  He does not wish to go back [place] at this time.    12:02 AM  Patient has completed a interview with [facility].  Patient is not currently taking any medications.  He has been on Zoloft and trazodone in the past.  He has not taken medicine in 4 months.       Amount and/or Complexity of Data Reviewed  Clinical lab tests: ordered and reviewed (Results for orders placed or performed during the hospital encounter of 08/18/17  -CBC WITH AUTOMATED DIFF       Result                      Value             Ref Range           WBC                         13.0 (H)          4.3 - 11.1 K*       RBC                         4.66              4.23 - 5.6 M*       HGB                         14.4              13.6 -  17.2 *       HCT                         42.8              41.1 - 50.3 %       MCV                         91.8              79.6 - 97.8 *       MCH                         30.9              26.1 - 32.9 *       MCHC                        33.6              31.4 - 35.0 *       RDW                         14.0              11.9 - 14.6 %       PLATELET                    260               150 - 450 K/*       MPV                         11.2              9.4 - 12.3 FL       ABSOLUTE NRBC               0.00              0.0 - 0.2 K/*       DF                          AUTOMATED                             NEUTROPHILS                 70                43 - 78 %           LYMPHOCYTES                 22                13 - 44 %            MONOCYTES                   7                 4.0 - 12.0 %        EOSINOPHILS  0 (L)             0.5 - 7.8 %         BASOPHILS                   0                 0.0 - 2.0 %         IMMATURE GRANULOCYTES       0                 0.0 - 5.0 %         ABS. NEUTROPHILS            9.2 (H)           1.7 - 8.2 K/*       ABS. LYMPHOCYTES            2.8               0.5 - 4.6 K/*       ABS. MONOCYTES              0.9               0.1 - 1.3 K/*       ABS. EOSINOPHILS            0.1               0.0 - 0.8 K/*       ABS. BASOPHILS              0.0               0.0 - 0.2 K/*       ABS. IMM. GRANS.            0.0               0.0 - 0.5 K/*  -METABOLIC PANEL, COMPREHENSIVE       Result                      Value             Ref Range           Sodium                      140               136 - 145 mm*       Potassium                   3.8               3.5 - 5.1 mm*       Chloride                    106               98 - 107 mmo*       CO2                         21                21 - 32 mmol*       Anion gap                   13  7 - 16 mmol/L       Glucose                     93                65 - 100 mg/*       BUN                         14                6 - 23 MG/DL        Creatinine                  1.20              0.8 - 1.5 MG*       GFR est AA                  >60               >60 ml/min/1*       GFR est non-AA              >60               >60 ml/min/1*       Calcium                     9.2               8.3 - 10.4 M*       Bilirubin, total            0.3               0.2 - 1.1 MG*       ALT (SGPT)                  38                12 - 65 U/L         AST (SGOT)                  37                15 - 37 U/L         Alk. phosphatase            94                50 - 136 U/L        Protein, total              8.1               6.3 - 8.2 g/*       Albumin                     4.0               3.5 - 5.0 g/*       Globulin                    4.1 (H)           2.3 - 3.5  g/*       A-G Ratio  1.0 (L)           1.2 - 3.5      -DRUG SCREEN, URINE       Result                      Value             Ref Range           PCP(PHENCYCLIDINE)          NEGATIVE                              BENZODIAZEPINES             NEGATIVE                              COCAINE                     POSITIVE                              AMPHETAMINES                NEGATIVE                              METHADONE                   NEGATIVE                              THC (TH-CANNABINOL)         NEGATIVE                              OPIATES                     NEGATIVE                              BARBITURATES                NEGATIVE                         -ETHYL ALCOHOL       Result                      Value             Ref Range           ALCOHOL(ETHYL),SERUM        130               MG/DL          -EKG, 12 LEAD, INITIAL       Result                      Value             Ref Range           Ventricular Rate            93  BPM                 Atrial Rate                 93                BPM                 P-R Interval                152               ms                  QRS Duration                82                ms                  Q-T Interval                360               ms                  QTC Calculation (Bezet)     447               ms                  Calculated P Axis           55                degrees             Calculated R Axis           44                degrees             Calculated T Axis           20                degrees             Diagnosis                                                     !!! Poor data quality, interpretation may be adversely affected   Normal sinus rhythm   Cannot rule out Anterior infarct , age undetermined   Abnormal ECG   No previous ECGs available     )  Tests in the radiology section of CPT??: ordered and reviewed (Xr Chest Pa Lat    Result Date: 08/18/2017  EXAM: Chest x-ray. INDICATION: Cough and leukocytosis. COMPARISON:  None. TECHNIQUE: Two view chest. FINDINGS: The lungs are clear. The cardiac size, mediastinal contour and pulmonary vasculature are normal. No pneumothorax or pleural effusion is seen.     IMPRESSION: Normal chest x-ray.    )           Procedures

## 2017-08-18 NOTE — ED Notes (Signed)
Pt states he has chest pain and that he is hearing people talk that are out to get him. Pt states he took for pain medications, did some cocaine and drank a 12 pack of beer. Pt states he has homicidal thoughts and states, "I haven't figured out how im gonna kill them motha fuckers." Py is alert and oriented. Pt states he has been off of his medications for a couple weeks now.

## 2017-08-18 NOTE — ED Provider Notes (Addendum)
Patient reports he drank a 12 pack of beer today and used cocaineall night last night.  He states he also took a couple of pain pills today that he believes were oxycodone.  [facility] wasn't [facility] not too long ago and is disappointed that he has relapsed.  Patient denies suicidal ideation but complained in triage were to EMS of some homicidal ideations.  There is no specific target.  Currently patient is somnolent and would like to sleep.  He awakes easily to voice and is oriented.    The history is provided by the patient.   Mental Health Problem    This is a recurrent problem. Pertinent negatives include no weakness, no hallucinations and no numbness.        History reviewed. No pertinent past medical history.    History reviewed. No pertinent surgical history.      History reviewed. No pertinent family history.    Social History     Socioeconomic History   ??? Marital status: SINGLE     Spouse name: Not on file   ??? Number of children: Not on file   ??? Years of education: Not on file   ??? Highest education level: Not on file   Occupational History   ??? Not on file   Social Needs   ??? Financial resource strain: Not on file   ??? Food insecurity:     Worry: Not on file     Inability: Not on file   ??? Transportation needs:     Medical: Not on file     Non-medical: Not on file   Tobacco Use   ??? Smoking status: Current Every Day Smoker   ??? Smokeless tobacco: Never Used   Substance and Sexual Activity   ??? Alcohol use: Yes   ??? Drug use: Yes     Types: Cocaine   ??? Sexual activity: Not on file   Lifestyle   ??? Physical activity:     Days per week: Not on file     Minutes per session: Not on file   ??? Stress: Not on file   Relationships   ??? Social connections:     Talks on phone: Not on file     Gets together: Not on file     Attends religious service: Not on file     Active member of club or organization: Not on file     Attends meetings of clubs or organizations: Not on file      Relationship status: Not on file   ??? Intimate partner violence:     Fear of current or ex partner: Not on file     Emotionally abused: Not on file     Physically abused: Not on file     Forced sexual activity: Not on file   Other Topics Concern   ??? Not on file   Social History Narrative   ??? Not on file         ALLERGIES: Patient has no known allergies.    Review of Systems   Constitutional: Negative for fever.   HENT: Positive for congestion. Negative for rhinorrhea.    Respiratory: Negative for cough and shortness of breath.    Cardiovascular: Negative for chest pain.   Gastrointestinal: Negative for abdominal pain, nausea and vomiting.   Endocrine: Negative for polyuria.   Genitourinary: Negative for dysuria and hematuria.   Musculoskeletal: Negative for back pain and neck pain.   Neurological: Negative for weakness  and numbness.   Psychiatric/Behavioral: Positive for dysphoric mood. Negative for hallucinations and suicidal ideas.       Vitals:    08/18/17 1816   BP: 114/68   Pulse: 94   Resp: 16   Temp: 98.3 ??F (36.8 ??C)   SpO2: 94%   Weight: 117.9 kg (260 lb)   Height: 6' 5"  (1.956 m)            Physical Exam   Constitutional: He is oriented to person, place, and time. He appears well-developed and well-nourished.   HENT:   Mouth/Throat: Oropharynx is clear and moist. No oropharyngeal exudate.   Eyes: Pupils are equal, round, and reactive to light. Conjunctivae are normal.   Neck: Neck supple.   Cardiovascular: Normal rate, regular rhythm and normal heart sounds.   Pulmonary/Chest: Effort normal and breath sounds normal.   Abdominal: Soft. Bowel sounds are normal. He exhibits no distension. There is no tenderness. There is no rebound and no guarding.   Musculoskeletal: Normal range of motion. He exhibits no edema or tenderness.   Lymphadenopathy:     He has no cervical adenopathy.   Neurological: He is alert and oriented to person, place, and time. No sensory deficit. He exhibits normal muscle tone.    Skin: Skin is warm and dry.   Nursing note and vitals reviewed.       MDM  Number of Diagnoses or Management Options  Diagnosis management comments: Allow patient to sober up and sleep.  Check labs, alcohol and UDS.  Reassess when patient is sober.    10:19 PM  Patient snoring and sleeping but arouses easily to voice.  He  DENIES HOMICIDAL OR SUICIDAL IDEATION> He is not psychotic.  Patient requesting alcohol and drug rehab-states [facility]. Has no place to stay here in [place] and usually lives in [place].  He does not wish to go back [facility] at this time.    12:02 AM  Patient has completed a interview with [facility].  Patient is not currently taking any medications.  He has been on Zoloft and trazodone in the past.  He has not taken medicine in 4 months.       Amount and/or Complexity of Data Reviewed  Clinical lab tests: ordered and reviewed (Results for orders placed or performed during the hospital encounter of 08/18/17  -CBC WITH AUTOMATED DIFF       Result                      Value             Ref Range           WBC                         13.0 (H)          4.3 - 11.1 K*       RBC                         4.66              4.23 - 5.6 M*       HGB                         14.4              13.6 -  17.2 *       HCT                         42.8              41.1 - 50.3 %       MCV                         91.8              79.6 - 97.8 *       MCH                         30.9              26.1 - 32.9 *       MCHC                        33.6              31.4 - 35.0 *       RDW                         14.0              11.9 - 14.6 %       PLATELET                    260               150 - 450 K/*       MPV                         11.2              9.4 - 12.3 FL       ABSOLUTE NRBC               0.00              0.0 - 0.2 K/*       DF                          AUTOMATED                             NEUTROPHILS                 70                43 - 78 %            LYMPHOCYTES                 22                13 - 44 %           MONOCYTES                   7                 4.0 - 12.0 %        EOSINOPHILS  0 (L)             0.5 - 7.8 %         BASOPHILS                   0                 0.0 - 2.0 %         IMMATURE GRANULOCYTES       0                 0.0 - 5.0 %         ABS. NEUTROPHILS            9.2 (H)           1.7 - 8.2 K/*       ABS. LYMPHOCYTES            2.8               0.5 - 4.6 K/*       ABS. MONOCYTES              0.9               0.1 - 1.3 K/*       ABS. EOSINOPHILS            0.1               0.0 - 0.8 K/*       ABS. BASOPHILS              0.0               0.0 - 0.2 K/*       ABS. IMM. GRANS.            0.0               0.0 - 0.5 K/*  -METABOLIC PANEL, COMPREHENSIVE       Result                      Value             Ref Range           Sodium                      140               136 - 145 mm*       Potassium                   3.8               3.5 - 5.1 mm*       Chloride                    106               98 - 107 mmo*       CO2                         21                21 - 32 mmol*       Anion gap                   13  7 - 16 mmol/L       Glucose                     93                65 - 100 mg/*       BUN                         14                6 - 23 MG/DL        Creatinine                  1.20              0.8 - 1.5 MG*       GFR est AA                  >60               >60 ml/min/1*       GFR est non-AA              >60               >60 ml/min/1*       Calcium                     9.2               8.3 - 10.4 M*       Bilirubin, total            0.3               0.2 - 1.1 MG*       ALT (SGPT)                  38                12 - 65 U/L         AST (SGOT)                  37                15 - 37 U/L         Alk. phosphatase            94                50 - 136 U/L        Protein, total              8.1               6.3 - 8.2 g/*       Albumin                     4.0               3.5 - 5.0 g/*        Globulin                    4.1 (H)           2.3 - 3.5 g/*       A-G Ratio  1.0 (L)           1.2 - 3.5      -DRUG SCREEN, URINE       Result                      Value             Ref Range           PCP(PHENCYCLIDINE)          NEGATIVE                              BENZODIAZEPINES             NEGATIVE                              COCAINE                     POSITIVE                              AMPHETAMINES                NEGATIVE                              METHADONE                   NEGATIVE                              THC (TH-CANNABINOL)         NEGATIVE                              OPIATES                     NEGATIVE                              BARBITURATES                NEGATIVE                         -ETHYL ALCOHOL       Result                      Value             Ref Range           ALCOHOL(ETHYL),SERUM        130               MG/DL          -EKG, 12 LEAD, INITIAL       Result                      Value             Ref Range           Ventricular Rate            93  BPM                 Atrial Rate                 93                BPM                 P-R Interval                152               ms                  QRS Duration                82                ms                  Q-T Interval                360               ms                  QTC Calculation (Bezet)     447               ms                  Calculated P Axis           55                degrees             Calculated R Axis           44                degrees             Calculated T Axis           20                degrees             Diagnosis                                                     !!! Poor data quality, interpretation may be adversely affected   Normal sinus rhythm   Cannot rule out Anterior infarct , age undetermined   Abnormal ECG   No previous ECGs available     )  Tests in the radiology section of CPT??: ordered and reviewed (Xr Chest Pa Lat    Result Date: 08/18/2017   EXAM: Chest x-ray. INDICATION: Cough and leukocytosis. COMPARISON: None. TECHNIQUE: Two view chest. FINDINGS: The lungs are clear. The cardiac size, mediastinal contour and pulmonary vasculature are normal. No pneumothorax or pleural effusion is seen.     IMPRESSION: Normal chest x-ray.    )           Procedures

## 2017-08-18 NOTE — ED Triage Notes (Signed)
Patient complains of chest pain, HI, and paranoia that has been ongoing for a few months. Patient states he decided to come in today due to chest tightness that started this morning. Patient denies any shortness of breath, cough, congestion, dizziness, weakness, nausea or vomiting.

## 2017-08-18 NOTE — ED Triage Notes (Signed)
[place] [facility] [place], pt c/o homelessness, paranoia for 2 month, homicidal, denies suicidal. Has consumed also of crack and alcohol last night. VSS. EMS reports no weapons on person at this time.

## 2017-08-19 LAB — EKG 12-LEAD
Atrial Rate: 93 {beats}/min
P Axis: 55 degrees
P-R Interval: 152 ms
Q-T Interval: 360 ms
QRS Duration: 82 ms
QTc Calculation (Bazett): 447 ms
R Axis: 44 degrees
T Axis: 20 degrees
Ventricular Rate: 93 {beats}/min

## 2017-08-19 LAB — DRUG SCREEN, URINE
AMPHETAMINES: NEGATIVE
Amphetamine Screen, Urine: NEGATIVE
BARBITURATES: NEGATIVE
BENZODIAZEPINES: NEGATIVE
Barbiturate Screen, Urine: NEGATIVE
Benzodiazepine Screen, Urine: NEGATIVE
COCAINE: POSITIVE
Cocaine Screen Urine: POSITIVE
METHADONE: NEGATIVE
Methadone Screen, Urine: NEGATIVE
OPIATES: NEGATIVE
Opiate Screen, Urine: NEGATIVE
PCP Screen, Urine: NEGATIVE
PCP(PHENCYCLIDINE): NEGATIVE
THC (TH-CANNABINOL): NEGATIVE
THC Screen, Urine: NEGATIVE

## 2017-08-19 LAB — EKG, 12 LEAD, INITIAL
Atrial Rate: 93 {beats}/min
Calculated P Axis: 55 degrees
Calculated R Axis: 44 degrees
Calculated T Axis: 20 degrees
P-R Interval: 152 ms
Q-T Interval: 360 ms
QRS Duration: 82 ms
QTC Calculation (Bezet): 447 ms
Ventricular Rate: 93 {beats}/min

## 2017-08-19 NOTE — Progress Notes (Signed)
Social Work has called cab to transport patient to [facility] on [address]. Patient updated and waiting in lobby.

## 2017-08-19 NOTE — ED Notes (Signed)
 Report received from Zach, [place].     Patient Safety Attendant Yes - Name: [facility]   Patient Safety Attendant Oriented YES   High risk patients are in line of sight at all times Yes   Excess equipment/medical supplies not necessary for the care of the patient removed Yes   All sharp or dangerous objects are removed from room: including but not limited to belts, pens & pencils, needles, medications, cosmetics, lighters, matches, nail files, watches, necklaces, glass objects, razors, razor blades, knives, aerosol sprays, drawstring pants, shoes, cords (telephone, call bells, etc.) cleaning wipes or other cleaning items, aluminum cans, not permanently attached wall dcor Yes   Telephone/cell phone removed as well as TV remote (batteries can be swallowed) Yes   Patient belongings removed and labeled at nurses station Yes   Excess linen is removed from room Yes   All plastic bags are removed from the room and replaced with paper trash bags Yes   Patient is in gown and using hospital socks with rubber soles Yes   No metal, hard eating utensils or hard plates are on meal tray Yes   Remove all cleaning agents used by [employer] Yes   Ensure bathroom door key is easily accessible Yes   If Crucifix is hanging on a nail, remove Crucifix as well as the nail Yes       *If any question above is answered No, documentation is required.

## 2017-08-19 NOTE — ED Notes (Signed)
Pt resting quietly. Respirations present, even and unlabored. No distress noted.

## 2017-08-19 NOTE — Progress Notes (Signed)
[facility] can take patient at 1pm today.

## 2017-08-19 NOTE — ED Notes (Signed)
Pt resting quietly in bed with eyes closed.  Snoring can be heard.  No distress noted.

## 2017-08-19 NOTE — ED Notes (Signed)
Report given to Shannon, RN care transferred at this time.

## 2017-08-19 NOTE — ED Notes (Signed)
Pt resting quietly. Respirations present, even and unlabored. No distress noted.

## 2017-08-19 NOTE — ED Notes (Signed)
Pt accepted at the  [facility].  Is to be discharged at 12pm and report to [facility] at 1 pm.

## 2017-08-19 NOTE — ED Notes (Signed)
 I have reviewed discharge instructions with the patient.  The patient verbalized understanding.    Patient left ED via Discharge Method: ambulatory to [facility] center with taxi.     Opportunity for questions and clarification provided.       Patient given 0 scripts.         To continue your aftercare when you leave the hospital, you may receive an automated call from our care team to check in on how you are doing.  This is a free service and part of our promise to provide the best care and service to meet your aftercare needs." If you have questions, or wish to unsubscribe from this service please call [phone].  Thank you for Choosing our [facility] Emergency Department.

## 2017-08-19 NOTE — ED Notes (Signed)
Report received from Zach, RN.     Patient Safety Attendant Yes - Name: [facility]   Patient Safety Attendant Oriented YES   High risk patients are in line of sight at all times Yes   Excess equipment/medical supplies not necessary for the care of the patient removed Yes   All sharp or dangerous objects are removed from room: including but not limited to belts, pens & pencils, needles, medications, cosmetics, lighters, matches, nail files, watches, necklaces, glass objects, razors, razor blades, knives, aerosol sprays, drawstring pants, shoes, cords (telephone, call bells, etc.) cleaning wipes or other cleaning items, aluminum cans, not permanently attached wall d??cor Yes   Telephone/cell phone removed as well as TV remote (batteries can be swallowed) Yes   Patient belongings removed and labeled at nurses station Yes   Excess linen is removed from room Yes   All plastic bags are removed from the room and replaced with paper trash bags Yes   Patient is in gown and using hospital socks with rubber soles Yes   No metal, hard eating utensils or hard plates are on meal tray Yes   Remove all cleaning agents used by [employer] Yes   Ensure bathroom door key is easily accessible Yes   If Crucifix is hanging on a nail, remove Crucifix as well as the nail Yes       *If any question above is answered "No," documentation is required.

## 2017-08-19 NOTE — Progress Notes (Signed)
[facility] can take patient at 1pm today.

## 2017-08-19 NOTE — ED Notes (Signed)
Pt resting quietly in bed with eyes closed.  Snoring can be heard.  No distress noted.

## 2017-08-19 NOTE — ED Notes (Signed)
I have reviewed discharge instructions with the patient.  The patient verbalized understanding.    Patient left ED via Discharge Method: ambulatory to [facility] center with taxi.     Opportunity for questions and clarification provided.       Patient given 0 scripts.         To continue your aftercare when you leave the hospital, you may receive an automated call from our care team to check in on how you are doing.  This is a free service and part of our promise to provide the best care and service to meet your aftercare needs.??? If you have questions, or wish to unsubscribe from this service please call [phone].  Thank you for Choosing our [facility].

## 2017-08-19 NOTE — ED Notes (Signed)
Pt accepted at the [facility].  Is to be discharged at 12pm and report to [facility] at 1 pm.

## 2017-08-19 NOTE — Progress Notes (Signed)
Social Work has called cab to transport patient to [facility] on [address]. Patient updated and waiting in lobby.

## 2018-04-01 ENCOUNTER — Inpatient Hospital Stay: Admit: 2018-04-01 | Discharge: 2018-04-01 | Disposition: A | Discharge status: Home / Self Care | Payer: MEDICAID

## 2018-04-01 ENCOUNTER — Inpatient Hospital Stay
Admission: EM | Admit: 2018-04-01 | Discharge: 2018-04-01 | Disposition: A | Discharge status: Home / Self Care | Payer: MEDICAID | Admitting: Psychiatry

## 2018-04-01 ENCOUNTER — Inpatient Hospital Stay
Admit: 2018-04-01 | Discharge: 2018-04-02 | Disposition: A | Discharge status: Home / Self Care | Payer: Medicaid (Managed Care) | Attending: Emergency Medicine

## 2018-04-01 DIAGNOSIS — Z760 Encounter for issue of repeat prescription: Secondary | ICD-10-CM

## 2018-04-01 DIAGNOSIS — F4320 Adjustment disorder, unspecified: Secondary | ICD-10-CM

## 2018-04-01 DIAGNOSIS — R45851 Suicidal ideations: Secondary | ICD-10-CM

## 2018-04-01 LAB — COMPREHENSIVE METABOLIC PANEL W/ REFLEX TO MG FOR LOW K
ALT: 35 U/L (ref 10–40)
AST: 27 U/L (ref 15–37)
Albumin/Globulin Ratio: 1.3 (ref 1.1–2.2)
Albumin: 4.6 g/dL (ref 3.4–5.0)
Alkaline Phosphatase: 111 U/L (ref 40–129)
Anion Gap: 14 (ref 3–16)
BUN: 9 mg/dL (ref 7–20)
CO2: 26 mmol/L (ref 21–32)
Calcium: 9.6 mg/dL (ref 8.3–10.6)
Chloride: 90 mmol/L — ABNORMAL LOW (ref 99–110)
Creatinine: 1.1 mg/dL (ref 0.9–1.3)
GFR African American: >60 (ref >60)
GFR Non-African American: >60 (ref >60)
Globulin: 3.5 g/dL
Glucose: 115 mg/dL — ABNORMAL HIGH (ref 70–99)
Potassium reflex Magnesium: 3.6 mmol/L (ref 3.5–5.1)
Sodium: 130 mmol/L — ABNORMAL LOW (ref 136–145)
Total Bilirubin: 0.6 mg/dL (ref 0.0–1.0)
Total Protein: 8.1 g/dL (ref 6.4–8.2)

## 2018-04-01 LAB — ETHANOL: Ethanol Lvl: NOT DETECTED mg/dL

## 2018-04-01 LAB — CBC WITH AUTO DIFFERENTIAL
Basophils %: 2.4 %
Basophils Absolute: 0.2 10*3/uL (ref 0.0–0.2)
Eosinophils %: 0.3 %
Eosinophils Absolute: 0 10*3/uL (ref 0.0–0.6)
Hematocrit: 43.7 % (ref 40.5–52.5)
Hemoglobin: 15.2 g/dL (ref 13.5–17.5)
Lymphocytes %: 12.7 %
Lymphocytes Absolute: 1.1 10*3/uL (ref 1.0–5.1)
MCH: 30.5 pg (ref 26.0–34.0)
MCHC: 34.7 g/dL (ref 31.0–36.0)
MCV: 87.9 fL (ref 80.0–100.0)
MPV: 9.9 fL (ref 5.0–10.5)
Monocytes %: 8.3 %
Monocytes Absolute: 0.7 10*3/uL (ref 0.0–1.3)
Neutrophils %: 76.3 %
Neutrophils Absolute: 6.4 10*3/uL (ref 1.7–7.7)
Platelets: 196 10*3/uL (ref 135–450)
RBC: 4.97 M/uL (ref 4.20–5.90)
RDW: 14.5 % (ref 12.4–15.4)
WBC: 8.4 10*3/uL (ref 4.0–11.0)

## 2018-04-01 LAB — ACETAMINOPHEN LEVEL: Acetaminophen Level: <5 ug/mL — ABNORMAL LOW (ref 10–30)

## 2018-04-01 LAB — SALICYLATE LEVEL: Salicylate, Serum: <0.3 mg/dL — ABNORMAL LOW (ref 15.0–30.0)

## 2018-04-01 LAB — POC MEDTOX DRUG SCREEN, URINE
Amphetamine, 500 ng/mL Cutoff: POSITIVE
Barbiturates, 200 ng/mL Cutoff: NEGATIVE
Benzodiazepines, 150 ng/mL Cutoff: NEGATIVE
Buprenorphine, 10 ng/mL Cutoff: NEGATIVE
Cocaine metabolite, 150 ng/mL Cutoff: POSITIVE
Methadone, 200 ng/mL Cutoff: NEGATIVE
Methamphetamine, 500 ng/mL Cutoff: POSITIVE
Opiates, 100 ng/mL Cutoff: NEGATIVE
Oxycodone, 100 ng/mL Cutoff: NEGATIVE
Phencyclidine, 25 ng/mL Cutoff: NEGATIVE
Propoxyphene, 300 ng/mL Cutoff: NEGATIVE
THC, 50 ng/mL Cutoff: NEGATIVE
Tricyclic Antidepressants, 300 ng/mL Cutoff: NEGATIVE

## 2018-04-01 LAB — MEDTOX DRUG SCREEN W/ FENTANYL: Fentanyl, 2 ng/mL Cutoff: NEGATIVE

## 2018-04-01 MED ORDER — OLANZAPINE 10 MG PO TABS
10 MG | Freq: Once | ORAL | Status: AC
Start: 2018-04-01 — End: 2018-04-01
  Administered 2018-04-01: 10 mg via ORAL

## 2018-04-01 MED ORDER — nicotine (polacrilex) (NICORETTE/NICORELIEF) gum 2 mg
2 | BUCCAL | Status: AC | PRN
Start: 2018-04-01 — End: 2018-04-01
  Administered 2018-04-01: 10:00:00 2 mg via ORAL

## 2018-04-01 MED FILL — OLANZAPINE 10 MG PO TABS: 10 mg | ORAL | Qty: 1

## 2018-04-01 NOTE — ED Notes (Signed)
Call placed to Dr. Adella Hare. Message left awaiting call back.      Willy Eddy, RN  04/01/18 [phone]

## 2018-04-01 NOTE — ED Notes (Signed)
Blood drawn without incident.      Athena Masse, RN  04/01/18 1749

## 2018-04-01 NOTE — ED Notes (Signed)
Pt becoming more and more fixated on leaving, remains highly delusional and restless. Refuses to accept medications at this time.  Pt allowed VS recheck and BP and HR remain elevated and pt is now reporting vague chest discomfort.      Para March, RN  04/01/18 [phone]

## 2018-04-01 NOTE — ED Notes (Signed)
Restraints removed. Pt sleeping soundly.     Para March, RN  04/01/18 [phone]

## 2018-04-01 NOTE — ED Provider Notes (Addendum)
Patient initially seen and evaluated by Dr. Pati Gallo.    Briefly, patient presents to the emergency department complaining of paranoia with suicidal/homicidal ideation.  Patient reportedly also used cocaine and methamphetamines yesterday.      Physical exam: General: No acute distress.  Patient alert and oriented.  Ambulating throughout the PACU without difficulty.  No current suicidal or homicidal ideation.        Results for orders placed or performed during the hospital encounter of 04/01/18   CBC auto differential   Result Value Ref Range    WBC 8.4 4.0 - 11.0 K/uL    RBC 4.97 4.20 - 5.90 M/uL    Hemoglobin 15.2 13.5 - 17.5 g/dL    Hematocrit 44.0 34.7 - 52.5 %    MCV 87.9 80.0 - 100.0 fL    MCH 30.5 26.0 - 34.0 pg    MCHC 34.7 31.0 - 36.0 g/dL    RDW 42.5 95.6 - 38.7 %    Platelets 196 135 - 450 K/uL    MPV 9.9 5.0 - 10.5 fL    Neutrophils % 76.3 %    Lymphocytes % 12.7 %    Monocytes % 8.3 %    Eosinophils % 0.3 %    Basophils % 2.4 %    Neutrophils Absolute 6.4 1.7 - 7.7 K/uL    Lymphocytes Absolute 1.1 1.0 - 5.1 K/uL    Monocytes Absolute 0.7 0.0 - 1.3 K/uL    Eosinophils Absolute 0.0 0.0 - 0.6 K/uL    Basophils Absolute 0.2 0.0 - 0.2 K/uL   Comprehensive Metabolic Panel w/ Reflex to MG   Result Value Ref Range    Sodium 130 (L) 136 - 145 mmol/L    Potassium reflex Magnesium 3.6 3.5 - 5.1 mmol/L    Chloride 90 (L) 99 - 110 mmol/L    CO2 26 21 - 32 mmol/L    Anion Gap 14 3 - 16    Glucose 115 (H) 70 - 99 mg/dL    BUN 9 7 - 20 mg/dL    CREATININE 1.1 0.9 - 1.3 mg/dL    GFR Non-African American >60 >60    GFR African American >60 >60    Calcium 9.6 8.3 - 10.6 mg/dL    Total Protein 8.1 6.4 - 8.2 g/dL    Alb 4.6 3.4 - 5.0 g/dL    Albumin/Globulin Ratio 1.3 1.1 - 2.2    Total Bilirubin 0.6 0.0 - 1.0 mg/dL    Alkaline Phosphatase 111 40 - 129 U/L    ALT 35 10 - 40 U/L    AST 27 15 - 37 U/L    Globulin 3.5 g/dL   Ethanol   Result Value Ref Range    Ethanol Lvl None Detected mg/dL   Salicylate   Result  Value Ref Range    Salicylate, Serum <0.3 (L) 15.0 - 30.0 mg/dL   Acetaminophen level   Result Value Ref Range    Acetaminophen Level <5 (L) 10 - 30 ug/mL   Urinalysis Reflex to Culture   Result Value Ref Range    Color, UA Yellow Straw/Yellow    Clarity, UA Clear Clear    Glucose, Ur Negative Negative mg/dL    Bilirubin Urine Negative Negative    Ketones, Urine Negative Negative mg/dL    Specific Gravity, UA <=1.005 1.005 - 1.030    Blood, Urine SMALL (A) Negative    pH, UA 6.5 5.0 - 8.0    Protein, UA Negative Negative  mg/dL    Urobilinogen, Urine 0.2 <2.0 E.U./dL    Nitrite, Urine Negative Negative    Leukocyte Esterase, Urine Negative Negative    Microscopic Examination YES     Urine Type NotGiven     Urine Reflex to Culture Not Indicated    Urine Drug Screen   Result Value Ref Range    Amphetamine Screen, Urine POSITIVE (A) Negative <1000ng/mL    Barbiturate Screen, Ur Neg Negative <200 ng/mL    Benzodiazepine Screen, Urine Neg Negative <200 ng/mL    Cannabinoid Scrn, Ur Neg Negative <50 ng/mL    Cocaine Metabolite Screen, Urine POSITIVE (A) Negative <300 ng/mL    Opiate Scrn, Ur Neg Negative <300 ng/mL    PCP Screen, Urine Neg Negative <25 ng/mL    Methadone Screen, Urine Neg Negative <300 ng/mL    Propoxyphene Scrn, Ur Neg Negative <300 ng/mL    Oxycodone Urine Neg Negative <100 ng/ml    pH, UA 6.5     Drug Screen Comment: see below    Microscopic Urinalysis   Result Value Ref Range    WBC, UA None seen 0 - 5 /HPF    RBC, UA 5-10 (A) 0 - 4 /HPF    Bacteria, UA Rare (A) None Seen /HPF   Troponin   Result Value Ref Range    Troponin <0.01 <0.01 ng/mL   Troponin   Result Value Ref Range    Troponin <0.01 <0.01 ng/mL   EKG 12 Lead   Result Value Ref Range    Ventricular Rate 83 BPM    Atrial Rate 83 BPM    P-R Interval 160 ms    QRS Duration 78 ms    Q-T Interval 388 ms    QTc Calculation (Bazett) 455 ms    P Axis 70 degrees    R Axis 60 degrees    T Axis 34 degrees    Diagnosis Normal sinus rhythmNormal ECG             EKG: Normal sinus rhythm with a rate of 83.  Normal axis.  Normal intervals and durations.  No ST or T wave changes appreciated.  No acute signs of ischemia.  No previous EKG.       Patient medically cleared.    1. Polysubstance abuse (HCC)    2. Passive suicidal ideations                  Phineas Semen, DO  04/02/18 [phone]       Phineas Semen, DO  04/02/18 [phone]
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
## 2018-04-01 NOTE — ED Provider Notes (Signed)
[facility] ED  EMERGENCY DEPARTMENT ENCOUNTER        Pt Name: Joshua Solomon  MRN: [id number]  Birthdate [birth year]  Date of evaluation: 04/01/2018  Provider: Arnette Schaumann, PA-C  PCP: No primary care provider on file.    This patient was seen and evaluated by the attending physician Dr. Gershon Crane.       CHIEF COMPLAINT       Chief Complaint   Patient presents with   ??? Psychiatric Evaluation     pt paranoid.  states SI,HI.  admitts to using cocaine and meth yesterday.       HISTORY OF PRESENT ILLNESS   (Location, Timing/Onset, Context/Setting, Quality, Duration, Modifying Factors, Severity, Associated Signs and Symptoms)  Note limiting factors.     Joshua Solomon is a 48 y.o. male with a significant past medical history of anxiety, depression, hyperlipidemia, hypertension brought in today for evaluation of SI and HI.  Patient admits to being paranoid.  He also he admits to using cocaine and methamphetamine yesterday.  He is from [place] and used to live with a sister down in [place] and made his way back up to [place] just recently.  He is also feeling paranoid and feeling like a gang is following him after him.  He states he feels sometimes like he does not want to live but denies any suicidal plan.  He has never tried to harm himself or others.   He does admit to using methamphetamine and cocaine.  He also is a daily drinker.  He is unsure how much he is been drinking.  Unsure when his last was.  He denies any other complaints.  No aggravating or alleviating complaints.  Denies any other concerns.  Patient is future oriented and talks about his family and daughter and how he wants to go back to [place] to be with his sister.    Nursing Notes were all reviewed and agreed with or any disagreements were addressed in the HPI.    REVIEW OF SYSTEMS    (2-9 systems for level 4, 10 or more for level 5)     Review of Systems   Constitutional: Negative.    Respiratory: Negative.     Gastrointestinal: Negative.    Genitourinary: Negative.    Musculoskeletal: Negative.    Neurological: Negative.    Psychiatric/Behavioral: Positive for agitation, behavioral problems and suicidal ideas. The patient is nervous/anxious.        Positives and Pertinent negatives as per HPI.  Except as noted above in the ROS, all other systems were reviewed and negative.       PAST MEDICAL HISTORY     Past Medical History:   Diagnosis Date   ??? Anxiety    ??? Depression    ??? Hyperlipidemia    ??? Hypertension          SURGICAL HISTORY   History reviewed. No pertinent surgical history.      CURRENTMEDICATIONS       There are no discharge medications for this patient.        ALLERGIES     Patient has no known allergies.    FAMILYHISTORY     History reviewed. No pertinent family history.       SOCIAL HISTORY       Social History     Tobacco Use   ??? Smoking status: Current Every Day Smoker   Substance Use Topics   ??? Alcohol use: Yes   ???  Drug use: Yes     Types: Methamphetamines, Cocaine       SCREENINGS             PHYSICAL EXAM    (up to 7 for level 4, 8 or more for level 5)     ED Triage Vitals [04/01/18 1715]   BP Temp Temp Source Pulse Resp SpO2 Height Weight   (!) 172/103 98 ??F (36.7 ??C) Oral 120 18 97 % -- 260 lb (117.9 kg)       Physical Exam  Vitals signs and nursing note reviewed.   Constitutional:       Appearance: He is well-developed. He is not diaphoretic.   HENT:      Head: Normocephalic and atraumatic.      Nose: Nose normal.   Eyes:      General:         Right eye: No discharge.         Left eye: No discharge.   Neck:      Musculoskeletal: Normal range of motion and neck supple.   Cardiovascular:      Rate and Rhythm: Normal rate and regular rhythm.      Heart sounds: Normal heart sounds. No murmur. No gallop.    Pulmonary:      Effort: Pulmonary effort is normal. No respiratory distress.      Breath sounds: Normal breath sounds. No wheezing or rales.   Chest:      Chest wall: No tenderness.   Musculoskeletal:  Normal range of motion.         General: No deformity.   Skin:     General: Skin is warm and dry.   Neurological:      Mental Status: He is alert and oriented to person, place, and time.   Psychiatric:         Attention and Perception: He perceives auditory hallucinations.         Mood and Affect: Mood is anxious.         Behavior: Behavior is uncooperative and agitated.         Thought Content: Thought content is paranoid. Thought content includes suicidal ideation.         DIAGNOSTIC RESULTS   LABS:    Labs Reviewed   COMPREHENSIVE METABOLIC PANEL W/ REFLEX TO MG FOR LOW K - Abnormal; Notable for the following components:       Result Value    Sodium 130 (*)     Chloride 90 (*)     Glucose 115 (*)     All other components within normal limits    Narrative:     Performed at:  [facility]  [address],  [place], [place] [address]   Phone [phone]   SALICYLATE LEVEL - Abnormal; Notable for the following components:    Salicylate, Serum <0.3 (*)     All other components within normal limits    Narrative:     Performed at:  [facility]  [address],  [place], [place] [address]   Phone [phone]   ACETAMINOPHEN LEVEL - Abnormal; Notable for the following components:    Acetaminophen Level <5 (*)     All other components within normal limits    Narrative:     Performed at:  [facility]  [address],  [place], [place] [address]   Phone [phone]   URINE RT  REFLEX TO CULTURE - Abnormal; Notable for the following components:    Blood, Urine SMALL (*)     All other components within normal limits    Narrative:     Performed at:  [facility]  [address],  [place], [place] [id number]   Phone [phone]   URINE DRUG SCREEN - Abnormal; Notable for the following components:    Amphetamine Screen, Urine POSITIVE (*)     Cocaine Metabolite Screen, Urine POSITIVE (*)     All other components  within normal limits    Narrative:     Performed at:  [facility]  [address],  [place], [place] [id number]   Phone [phone]   MICROSCOPIC URINALYSIS - Abnormal; Notable for the following components:    RBC, UA 5-10 (*)     Bacteria, UA Rare (*)     All other components within normal limits    Narrative:     Performed at:  [facility]  [address],  [place], [place] [address]   Phone [phone]   CBC WITH AUTO DIFFERENTIAL    Narrative:     Performed at:  [facility]  [address],  [place], [place] [id number]   Phone [phone]   ETHANOL    Narrative:     Performed at:  [facility]  [address],  [place], [place] [id number]   Phone [phone]   TROPONIN    Narrative:     Performed at:  [facility]  [address],  [place], [place] [id number]   Phone [phone]   TROPONIN    Narrative:     Performed at:  [facility]  [address],  [place], [place] [address]   Phone [phone]       All other labs were within normal range or not returned as of this dictation.    EKG: All EKG's are interpreted by the Emergency Department Physician in the absence of a cardiologist.  Please see their note for interpretation of EKG.      RADIOLOGY:   Non-plain film images such as CT, Ultrasound and MRI are read by the radiologist. Plain radiographic images are visualized and preliminarily interpreted by the ED Provider with the below findings:        Interpretation per the Radiologist below, if available at the time of this note:    No orders to display     No results found.        PROCEDURES   Unless otherwise noted below, none     Procedures    CRITICAL CARE TIME   N/A    CONSULTS:  None      EMERGENCY DEPARTMENT COURSE and DIFFERENTIAL DIAGNOSIS/MDM:   Vitals:    Vitals:    04/01/18 1715 04/01/18 1930 04/02/18 0145 04/02/18 0758    BP: (!) 172/103 (!) 161/111 122/76 (!) 127/93   Pulse: 120 120 88 104   Resp: 18 18 16 16    Temp: 98 ??F (36.7 ??C) 98 ??F (36.7 ??C)  97.6 ??F (36.4 ??C)   TempSrc: Oral Oral  Oral   SpO2: 97% 99% 98% 97%   Weight: 260 lb (117.9 kg)          Patient was given the following medications:  Medications   OLANZapine (ZYPREXA) tablet 10 mg (10  mg Oral Given 04/01/18 1849)   LORazepam (ATIVAN) tablet 2 mg (2 mg Oral Given 04/01/18 2022)   diphenhydrAMINE (BENADRYL) injection 50 mg (50 mg Intramuscular Given 04/01/18 2102)   haloperidol lactate (HALDOL) injection 5 mg (5 mg Intramuscular Given 04/01/18 2101)   LORazepam (ATIVAN) injection 1 mg (1 mg Intramuscular Given 04/01/18 2102)       Patient brought in today for evaluation of paranoia as well as initially SI and HI.  On exam patient is alert and oriented answers questions appropriately.  Patient is tachycardic 120 afebrile breathing on room air satting at 97%.    Patient appears nontoxic at this time.  CBC reveals no acute leukocytosis.  Hemoglobin 15.  No acute electrolyte abnormalities.  Ethanol is negative.  Salicylate levels negative.  Acetaminophen levels negative.    Patient initially given Zyprexa.  Patient was also given B-52.  Please see my attendings note for the rest of the work-up as well.  Patient will be evaluated by our psychiatric team here in the ER to determine final disposition.  See their note as well.      FINAL IMPRESSION      1. Polysubstance abuse (HCC)    2. Passive suicidal ideations          DISPOSITION/PLAN   DISPOSITION Decision To Discharge 04/02/2018 08:09:16 AM      PATIENT REFERREDTO:  [facility]  [address]  [place] [id number]  [phone]    If symptoms worsen      DISCHARGE MEDICATIONS:  There are no discharge medications for this patient.      DISCONTINUED MEDICATIONS:  There are no discharge medications for this patient.             (Please note that portions of this note were completed with a voice recognition  program.  Efforts were made to edit the dictations but occasionally words are mis-transcribed.)    Arnette SchaumannMegan Eran Mistry, PA-C (electronically signed)            Arnette SchaumannMegan Jezabel Lecker, PA-C  04/02/18 1352

## 2018-04-01 NOTE — ED Notes (Signed)
Patient brought back to [facility] by ED charge RN, Kriste Basque. Patient belongings collected and secured in assigned locker, B4. Patient requested and given water to drink. Offered to order dinner for patient, but patient declined at this time.     Athena Masse, RN  04/01/18 1728

## 2018-04-01 NOTE — ED Notes (Signed)
Dinner tray ordered for patient     Athena Masse, RN  04/01/18 [phone]

## 2018-04-01 NOTE — ED Notes (Signed)
Patient sister Inetta Fermo number is 1 [phone]     Willy Eddy, RN  04/01/18 [phone]

## 2018-04-01 NOTE — ED Notes (Signed)
Spoke to Dr. Adella Hare. Dr. Adella Hare would like patient to be observed overnight in ED medicated and reevaluated in AM.      Willy Eddy, RN  04/01/18 1906
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
## 2018-04-01 NOTE — ED Notes (Signed)
Pt medicated with IM's of Ativan, Haldol and benadryl per order for agitation and inability to follow simple commands for safety.  Pt being monitored by staff d/t restraint use. Pt remains awake and occasionally struggling against restraints.       Para March, RN  04/01/18 2329

## 2018-04-01 NOTE — ED Notes (Signed)
Pt finally agreed to accept prescribed medication after speaking to Dr Gershon Crane.  He remains highly suspicious and voices insistance to leave.      Para March, RN  04/01/18 2034

## 2018-04-01 NOTE — ED Notes (Signed)
Pt sleeping soundly in assigned treatment room, respirations even and easy. Pt has been observed snoring loudly off and on and periodically repositioning self.
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
## 2018-04-01 NOTE — ED Provider Notes (Signed)
I independently interviewed, examined and evaluated Joshua Solomon.    In brief, patient is a 48 year old male presenting for psychiatric evaluation.  Patient was placed on a hold by EMS after he made suicidal statement.  He appears paranoid.  The patient is denying suicidal ideation here.  He states he asked for help to come here, but then states he needs to go home because he has kids, and he also has things he needs to deal with.  He is denying homicidal ideation or hallucinations.  He does admit to amphetamine use tonight.  Just prior to my evaluation he stated he has chest pain but was unable to cooperate for the EKG.  On exam he appears nontoxic medically.  His heart is tachycardic rate and regular rhythm.  He is paranoid, acutely agitated and appears internally stimulated.  He has pressured speech.  He denies suicidal ideation.    ED course: Patient arrives tachycardic and hypertensive likely multifactorial.  UDS is positive for amphetamines and cocaine.  Patient otherwise has lab work that is reviewed and unremarkable.  Troponin is negative.  We will attempt to obtain EKG when patient is more calm.  He is acutely agitated, standing in behavioral area with Deputy surrounding him.  He is not physically aggressive toward staff but is verbally aggressive.  I did have a long talk with the patient and he did appear to have acute paranoia and psychosis and does not exhibit capacity.  We did give oral Ativan and Zyprexa initially but this did not calm the patient.  He ultimately attempted to escape from behavioral area, and required four-point restraints for his safety.  He was then given Benadryl, IM Ativan, and Haldol with good response.  He was subsequently sleeping in the room.  He will be reevaluated in the morning.  I will ask for EKG to be obtained not the patient is calm and removed from restraints.  Vitals will be repeated as well.  Patient is signed out to my colleague Dr. Nedra Hai.     All diagnostic,  treatment, and disposition decisions were made by myself in conjunction with the advanced practice provider.    For all further details of the patient's emergency department visit, please see the advanced practice provider's documentation.    Comment: Please note this report has been produced using speech recognition software and may contain errors related to that system including errors in grammar, punctuation, and spelling, as well as words and phrases that may be inappropriate. If there are any questions or concerns please feel free to contact the dictating provider for clarification.         [place], [place]  04/02/18 [phone]

## 2018-04-01 NOTE — Unmapped (Signed)
[facility] ED Note    Date of service:  04/01/2018    Reason for Visit: Medication Refill      Patient History     HPI:  This patient comes to the emergency department asking for refill of medications for high blood pressure, high cholesterol and psych medications.  The patient recently migrated here from [place] and is homeless.  He does not know the names of any of his medications except for trazodone.  He reports that while in [place] his back was done with his medications in them.  This patient went to [facility] last night after approaching police and informing him that he needed help.  He presented to [facility] stating that he had some suicidal ideation and that he had lot cocaine that her not actually be methamphetamine.  He also is requesting substance abuse help from [facility].  There report notes that he was released this morning with resources as well as given an [employer] ride to the [employer], and that he was not a danger to himself.  The patient reports that he went to the [facility] house but they did not have a bed for him.  He is requesting to speak with social work about resources because he believes he forgot whenever paper he was given prior to leaving [facility].  He denies any medical complaints at this time including shortness of breath, chest pain, abdominal pain, dizziness, headache.  He denies suicidal or homicidal ideation at this time.  He does endorse mild depression and anxiety at this time.       Past Medical History:   Diagnosis Date   ??? Anxiety    ??? Depression    ??? Polysubstance dependence (CMS Dx)        History reviewed. No pertinent surgical history.    Jared French  reports that he has been smoking cigarettes. He has a 16.00 pack-year smoking history. He has never used smokeless tobacco. He reports current alcohol use of about 84.0 standard drinks of alcohol per week. He reports current drug use. Drugs: Crack cocaine, Methamphetamines, and  Cocaine.    Previous Medications    No medications on file       Allergies:   Allergies as of 04/01/2018   ??? (No Known Allergies)       Review of Systems     ROS:  Review of Systems   Constitutional: Negative for chills and fever.   Respiratory: Negative for shortness of breath.    Cardiovascular: Negative for chest pain.   Gastrointestinal: Negative for abdominal pain, nausea and vomiting.   Neurological: Negative for dizziness.   Psychiatric/Behavioral: Positive for depression and substance abuse. Negative for suicidal ideas. The patient is nervous/anxious.            Physical Exam     ED Triage Vitals [04/01/18 1057]   Vital Signs Group      Temp 98.4 ??F (36.9 ??C)      Temp Source Oral      Heart Rate 109      Heart Rate Source Monitor      Resp 15      SpO2 98 %      BP (!) 161/97      MAP (mmHg)       BP Location Right arm      BP Method Automatic      Patient Position Sitting   SpO2 98 %   O2 Device  General:  Nontoxic, well-appearing and in no distress. Resting in bed comfortably.   ??  HEENT:   Normocephalic, atraumatic.  ??  Neck:  Supple, no JVD; trachea midline    ??  Pulmonary:   Clear to auscultation bilaterally. Normal effort. No distress.   ??  Cardiac:  Regular rate and rhythm; without murmurs, rubs, gallops.   ??  Musculoskeletal:  MAE.      Skin:  Warm, dry. No cyanosis or pallor. No rash.   ??  Neuro:  A&O x4.  ??  Psych:  Normal mood and affect for clinical situation.         Diagnostic Studies       Radiology:    Please see electronic medical record for any tests performed in the ED        Emergency Department Procedures         ED Course and MDM     Jared French is a 48 y.o. male who presented to the emergency department with Medication Refill       This patient comes to the emergency department asking for refill of medications for high blood pressure, high cholesterol and psych medications.  The patient recently migrated here from [place] and is homeless.  He does not know the names of  any of his medications except for trazodone.  He reports that while in [place] his back was done with his medications in them.  This patient went to [facility] last night after approaching police and informing him that he needed help.  He presented to [facility] stating that he had some suicidal ideation and that he had lot cocaine that her not actually be methamphetamine.  He also is requesting substance abuse help from [facility].  There report notes that he was released this morning with resources as well as given an Benedetto Goad ride to the [employer], and that he was not a danger to himself.  The patient reports that he went to the [facility] house but they did not have a bed for him.  He is requesting to speak with social work about resources because he believes he forgot whenever paper he was given prior to leaving [facility].  He denies any medical complaints at this time including shortness of breath, chest pain, abdominal pain, dizziness, headache.  He denies suicidal or homicidal ideation at this time.  He does endorse mild depression and anxiety at this time.  He is alert and oriented ??4.  Pulse is 109.  Blood pressure is 161/97.  On exam lungs are clear to auscultation bilaterally.  I spoke with social work who provided this patient resources.  I spoke with [facility] in the physician who discharged this patient who informed me that this patient did not make it to the [facility] house and rather than.  The ride that he had to drop him off somewhere else.  She reports that he was not prescribed medications on discharge [facility] due to him not having psych issues that warranted treatment at this time.  She reports that he is currently malingering and was given appropriate resources at discharge.     Repeat pulse was 99.    Jared French was notified of this patient came for community referral.  She arranged an appointment with a new PCP for 3 weeks from now.  PIP arranged for intake transportation for this patient to be admitted to [facility] for alcohol  detoxification. This patient was agreeable to this plan and was discharged  for transport to that facility.  Due to this patient not being aware of what his medications currently are, he will address those medications at his primary care physician appointment which is been scheduled by [facility].  [facility] has a he seen this patient and deemed that he did not need psychiatric medications at this time.      Critical Care Time (Attendings)        Viviann Spare B. Enma Maeda, PA  04/01/18 1539

## 2018-04-01 NOTE — Unmapped (Signed)
SW Note: A Lyft was offered to pt for transport to [facility], however the pt changed drop off to:   [address], [place], [place] [address], [place] by [facility] driver, Marlane Hatcher, [phone], Lisa Roca   License plate [id number]. SW attempted to call Lyft driver, but did not pick up.    There are no further SW needs at this time.

## 2018-04-01 NOTE — Unmapped (Addendum)
Per EIP, they are unable to find a program to accept the pt for detox due to the pt not being an [facility] resident and his Medicaid being out of state.  They advised that the pt needs to go to [facility] ER for detox since [facility] does not offer this service.      Pt is being discharged from [facility] and will sign in at the OSH as a new pt.  Lyft provided to [facility].  Pt's trip is $102.  Director Mynatt approved the trip and using emergency assistance.    SW followed the trip online and confirmed that the pt arrived at [facility] at [phone].    Jerilynn Birkenhead MSW, LSW  [employer] Social Worker  [phone]

## 2018-04-01 NOTE — Unmapped (Signed)
Pt states he is out of his blood pressure medication, cholesterol medications and psych medications. Pt states he recently moved to [place] and has not had him medications for the last month
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
## 2018-04-01 NOTE — Unmapped (Signed)
After reviewing Pts current insurance situation and treatment options, [facility] detox[facility] was determined to be the most appropriate option. Pt agreed to plan. Upon discharge the Pt will follow up with Social Work to help facilitate transportation.      Robie Ridge  Linkage and Peer Navigator (EIP)  [phone]

## 2018-04-01 NOTE — Unmapped (Addendum)
Patient observed awake in bed 13, alert and oriented to all. Patient reports he is here from out of town needing drug treatment, I am having suicidal thoughts. Patient denies a SI plan when asked. Contracts for safety. Patient avoided eye contact during interaction.Support and reassurance offered and accepted. Patient is maintained in secured,monitored area with no unsafe.Safety maintained with 15 minute checks.

## 2018-04-01 NOTE — ED Notes (Signed)
SW Note: SW met with pt who appears at baseline with no overt behaviors. He's wanting to get into substance abuse treatment. SW discussed treatment options and barriers related to his [employer]. The pt called various treatment programs and will go to [facility] and complete an intake.    At this time the pt appears appropriate for discharged. He vaguely endorses SI with no specific plan, but appears related to his homelessness. SW provided resources for suicide prevention, substance abuse, [facility], Holiday representative, and shelters.    There are no further SW needs at this time.
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
## 2018-04-01 NOTE — Unmapped (Signed)
Pt is a 48 yr old AA/M that was brought into PES by CPD on SOB.  Per SOB, Patient [place] stating he felt like harming himself.  Patient stated furhter he is from [place] and has nobod to turn to.  Patient stated he tried [employer] meth today and feels suicidal.  Looking for ways to harm himself.

## 2018-04-01 NOTE — ED Notes (Signed)
Patient reports he is on Trazodone. With further interaction patient informs nurse it has been over 6 months since he has been on any medication . Reports he received his prescription filled at [facility] in [place]. B.Friesen, PA interviewing with patient at this time.
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
## 2018-04-01 NOTE — Unmapped (Addendum)
Per your conversation with myself and EIP, go to [facility] via transport for your intake into [facility] facility for detoxification from alcohol and other rehabilitation.  Return to the emergency department with new or worsening symptoms including chest pain, shortness of breath or other concerning symptoms.  Contact the following agency for Mental Health Intake appointment if you decide to stay in [facility]  [address] [place], [place] [address]  Phone: [phone]
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
## 2018-04-01 NOTE — ED Notes (Signed)
Jared French has a PCP follow up scheduled at [facility] on March 12,2020 @1pm  (12:45 arrival) with Dr. Lindell Spar. Location: [address],[facility],2nd floor. The health center's phone number is [phone].    [employer]  [phone]  [phone]

## 2018-04-01 NOTE — Unmapped (Signed)
Sw called pt's mother, Castiel Lauricella, [phone].  After several rings, Mary answered and asked whoever it is calling this late not to call this late again.  Sw had to quickly identify self and let her know they were calling with regards to her son.  Mary asked if he was in trouble and where he was.  Sw explained what brought pt here.  Corrie Dandy states pt has never had hx of harming himself or others and he would never do that.  Mary asked if she could speak with her son and sw explained that he was talking with a nurse at the moment and sw would give him the message to call her afterwards.

## 2018-04-01 NOTE — Unmapped (Signed)
(  D) Client continues to watch television.   Client has been offered a bed, but thus far declined.   Writer and Client discussed alcohol and drug treatment.   No evidence that client is yet exhibiting any withdrawal symptoms from either alcohol or cocaine.  No inappropriate behaviors noted.    No signs nor symptoms of acute distress noted.   (A) 15 minute checks per policy. All safety measures appropriate for this patient in place. (R) safety maintained.

## 2018-04-01 NOTE — Unmapped (Signed)
Discharge Instructions After an Episode of Suicide Threats or Actions    Remove all firearms, weapons (of any kind), or any unneeded medicines that could be used. Identify a support person/advocate and attend follow-up mental health appointments with this person. Be direct and talk openly about suicidal thoughts. Allow expression of feelings. Block all inappropriate internet websites and social media. Get help from agencies that specialize in crisis intervention. Create a personalized safety plan.     The following list are suicide prevention resources available 24 hours a day.  National:    Suicide Prevention Lifeline  1.800.273.TALK (8255)  - The Lifeline provides 24/7, free and confidential support for people in distress, prevention and crisis resources for you or your loved ones, and best practices for professionals.    [place]:   Crisis Hotline ([facility])  513.281.CARE (2273)   - 24-hour telephone support services specializing in suicide prevention, crisis situations, and family violence     [facility] (PES) Mobile Crisis   [phone]   [address], [place], [place] [address]  - 24 hour psychiatric emergency mobile crisis unit trained to respond to mental health emergencies.      [place]:  [place] Mobile Crisis Team  1.844.427.4747  - 24 hour psychiatric emergency mobile crisis unit trained to respond to mental health emergencies.     [place]:   [place] 24-Hour Crisis Hotline  513.528.7283 (SAVE)  - Hotline people can call for support if they are experiencing mental health problems    [place]:   [place] Crisis Line  1.877.695.6333  - 24 hour psychiatric emergency mobile crisis unit trained to respond to mental health emergencies.      [place]:  [place] Crisis Hotline  859.331.3292  - 24-hour telephone support services specializing in suicide prevention, crisis situations, and family violence    Crisis Hotline ([facility])  513.281.CARE (2273)   Website: www.[facility].org  - 24-hour telephone support services specializing in suicide prevention, crisis situations, and family violence    [facility] ([facility]) Mobile Crisis  513.584.8577   [address], [place], [place] [address]  - Psychiatric emergency mobile crisis unit trained to respond to mental health emergencies    www.suicidepreventionlifeline.orgSubstance Use/Chemical Dependency Resources     You have been referred to substance use/chemical dependency outpatient treatment services OR a halfway house. It is recommended that you contact your preferred program as soon as possible to initiate services. The following is a list of outpatient services and halfway houses in [place] area:    Area Wide Resources:  Alcoholics Anonymous  513.351.0422  [address]; [place], [place] [address]  Website: www.[place].org    Narcotics Anonymous ([place] and [place])  513.820.2947  Website: www.nac[place].com    Addiction Services Council  513.281.7880 or 859.415.9280  [address]; [place], [place] [address]  - Provides assessment, referral and treatment for individual and families in the [place] area. They can assist with placement. Assessments are available by appointment or on a walk in basis.    VA Substance Dependence Program (SUDEP)  513.475.6353  - Phone 24/7. Intake and Referral; Detoxification Services; Residential Rehabilitation Services; Outpatient Rehabilitation; Dual Diagnosis; Substance Abuse/Posttraumatic Stress Disorder  [place] Substance use:  [facility]  [phone]   [address]; [place], [place] [address]   - Intensive outpatient is 3 times a week, 3 hours each time. Program lasts 6-8 weeks.  [facility] ([facility])    513.381.6672   [address]; [place], [place] [address]   - Detoxification; Community Residential; Outpatient; Intensive  Outpatient; Aftercare; Family Program; Relapse Program along with medication assistance.   [facility]  [phone]   [address]; [place], [place] [address]   - Outpatient; Intensive Outpatient; Medication Treatment for Opioid Addicts          [facility]  [phone]   [address]; [place], [place] [address]   - Woman's Community Residential; Intensive Outpatient; Outpatient, Pregnant and Parenting Woman's Residential, Therapeutic Child Care, Substance Abuse/Mental Illness (SAMI).  [facility]  [phone]   [address], [place], [place] [address]   - Chemical dependency assessments; Psychiatric evaluations; Adult and Adolescent alcohol and drug education groups; Individual therapy; Family conferences  [facility]  [phone]  [address]; [place], [place] [address] (in the [facility] Building)   - A nurse managed clinic specializing in mental health and substance abuse care for the homeless and indigent of [place].      [facility] ([facility]) 513.281.RHAC   [address]; [place], [place] [address]  - Centralized 24-hour service center. Services include; prevention programs, information and referrals, diagnostic assessments, treatment plans, and outpatient substance abuse counseling.  [facility]  513.354.5200  - Outpatient counseling, substance abuse assessment, treatment.    [facility]  513.281.2273  - Outpatient counseling, substance abuse assessment, treatment.     [place] Halfway Houses:  [facility] ([facility])  [phone] ext.[phone]  [address]; [place], [place] [address]   - 6 month residential Christian based recovery program for homeless men.  [facility]  [phone]   [address]; [place], [place] [address]   - Transitional housing for homeless men with substance abuse problems; 52 beds. Requires completion of detox program prior to admission call or details.  [facility]  [phone]   [address]; [place], [place] No website   - There are 7 rooms total, 4 meetings a week required: 12 step program. Call for details  [facility]  [phone]   [address]; [place], [place] [address]   - Long-term residential recovery program for men struggling with drug or alcohol addictions.         [facility]  [phone]   [address]; [place], [place] [address]   - Men live in two or three bedroom apartments; 50 bed facility. $275/month rent plus must buy own food; Employment within 30 days required. Call for further details.  [facility]  [phone]   [address]; [place], [place] [address]   - 25 unit permanent supportive housing for the chronically homeless with substance abuse problems. Based on a Housing First model, residents will have access to services to help them regain sobriety and independence  [facility]  [phone]   [address]; [place], [place] [address] ([place])   - Residential treatment facility for recovering male substance abusers; 60 beds. 90 day minimum stay  [facility]  [phone]   [address]; [place], [place] [address]   - 20 furnished apartments of transitional housing for men and women in recovery from drug and alcohol abuse.   [facility]  [phone]   [address]; [place], [place] [address]   - Faith-based, residential homes in [place] operated by [facility] for males needing support to establish life-long recovery from the disease of addiction/alcoholism.  [facility]  [phone]   [address]; [place], [place] [address]  -   Safe, affordable, sober housing for men and women recovering from alcohol and drug addiction. 7 houses in the [place] and [place] area. Residents are required to have stable employment or volunteer work.   [facility]  [phone]   [address]; [place], [place] [address]  - Housing for women 18+ diagnosed w/ substance abuse; LOS 3-6 months Allows children,  ages 12 and under to live with their mothers during treatment.       [facility]  [phone]   [address]; [place], [place] [address]   - Faith-based, residential homes in [place] operated by [facility] for females.    [place] Substance Use:  [facility] ([place]) 513.868.7654   - [facility] is a comprehensive alcohol and drug addiction treatment and mental health service provider.  [facility]  [phone]   [address]; [place], [place] [address] Website:   - Intensive outpatient is 3 times a week, 3 hours each time. Program lasts 6-8 weeks. Call for details  [facility]  [phone]  [address] [place], [place] [address]   - Adult Assessment and Outpatient Services. Individual and group treatment options.  [facility]  [phone]  - Offer both inpatient and outpatient treatment options for mental health and substance abuse.    [facility]  [phone]  - They provide clients with access to psychiatric therapy, medication management, and counseling. Call for details. [place] Halfway Houses:  [facility]  [phone]   [address]; [place] [address] Website: http://www.[facility]ner[facility].org  - Residential treatment for men, women, and children in recovery. Average length of stay: 3-6 months. There are 7 locations and all referrals go through their executive office   [facility])  [phone]   [address]; [place], [place] [address] Website: http://www.talberthouse.org  - A corrections program that provides assessment, treatment, and reintegration for adult males in a residential setting   [place] Substance Use Resources:  [facility]  [phone]   [address]; [place], [place] [address]   - Delivers comprehensive, customized care to families and individuals  suffering from substance abuse, mental illness and co-occurring illnesses.   - [facility] [phone]   [address], [place], [place] [address]   - Provides physician-driven outpatient drug and alcohol treatment for adolescents and adults. Offers outpatient rehab, family counseling, and after care.   [facility]  [phone]   [address], [place], [place] [address]   - Provides addiction services, integrated counseling, mental health counseling, case management, and Psychiatry services. [place] Halfway Houses:  [facility]  513.248.0452   [address]; [place], [place] [address]   Website: http://www.[facility].org/about  - [facility]: 7 -12 month faith based drug and alcohol residential treatment center for men ages 18-35   - [facility]: Residential faith based maternity home for pregnant and non-pregnant women in desperate circumstances - addiction, jail, destructive relationship, destitute, etc. ??? We fully subsidize up to 25% of our bed space for truly destitute individuals     [place] Substance Use:  [facility]  [phone]   [address], [place],  [address]   - Pre-hospital screening, ambulatory detox, and short-term crisis consoling for mental health or chemical dependency. Other locations in [place] and [place]  [facility]  [phone]   [address], [place], [place] [address]    - Provides addiction services, integrated counseling, mental health counseling, case management, and Psychiatry services.   [place]  [phone]   [address]; [place], [place] [address]   - Provides outpatient substance abuse treatment services for adolescents and adults  [facility] [phone]   - Mental health and substance abuse services in various locations throughout [place]   [place] Halfway Houses:  [facility]  [phone]   [address]; [place], [place] [address]  - Residential facility for women 18+ who are pregnant or dealing with abuse or addictions ??? Provides shelter, food, discipleship training, life skills preparation and job skills readiness     [place] Substance Use:  [facility]  [phone]   [address] [place], [place]  [address]  - Provides addiction services, integrated counseling, case management, and Psychiatry services.         [facility]  [phone]   [address]; [place] [address]   - Provides: Substance abuse treatment services on a sliding fee scale  [facility] [phone]   [address]; [place] [address] ([address])   - Outpatient substance abuse counseling offered on a sliding fee scale  [facility]  [phone]  [address]; [place] [address]   - Provides alcohol/drug counseling, Intensive Outpatient Programs for women and adolescents, Dual Diagnosis Treatment, and prevention programs  [facility]  [phone]   [address]; [place] [address]   - Medical Detox  [facility]  [phone]   [address]; [place] [address]   - 7-10 day Non-Medical Detox program for men and women; 90-day program for men in [place], [place] and 90-day program for women and their children in [place], [place]   - Men's Residential Recovery program; IOP substance abuse treatment; Call for full details. [place] Halfway Houses:  [facility]  [phone]   [address]; [place] [address]  - 100-bed residential treatment facility for recovering male substance abusers.  [facility]  [phone]   [address]; [place] [address]   - 9-12 month residential recovery program for men based on model used by the [facility] in [place], [place].  [facility]  [phone]  [address] [place],  [place]  - Residential treatment group home for recovering male substance abusers  [facility]  [phone]  [address]; [place], [place]                                   - Safe, affordable, sober housing for men and women recovering from alcohol and drug addiction. 7 houses in the [place] and [place] area. Residents are required to have stable employment or volunteer work. Rent is $70/week  [facility]  [phone]  [address], [place] [address]  - Transitional housing and case management services for adult women and men recovering from chemical dependency (11 units)

## 2018-04-01 NOTE — Unmapped (Signed)
AVS discharge instructions,patient education information on SI prevention,resources available in community, with recommended follow up with [facility],and [employer] reviewed with patient, verbalized understanding. Patient accepted copy of AVS. Patient with no unsafe,agressive,aggitated,or RTIS in PES this shift. Discharged with all patient belongings including from safe, ID, and cell phone. Patient given a lift by [facility] department to be taken to [facility] for treatment of addiction.

## 2018-04-01 NOTE — Unmapped (Signed)
[facility] Peer Nav approached Pt to identify needs. Linkage team is currently working on linkage to treatment. Will update once appropriate plan is in place.      Robie Ridge  Linkage and Peer Navigator  [phone]

## 2018-04-01 NOTE — Unmapped (Addendum)
PSW went to speak with pt about treatment services in the [place] area for both substance use and mental health. He was at [facility] this morning, but stated that he lost the papers he was given. PSW provided him with resources for [employer], [employer], [employer] and [address], should he remain in [place]. Pt stated that he had somoene transfer his medicaid to [place] this morning, so he should be able to get into a program here. PSW advised him that he would most likely need some sort of proof of insurance such as a card or a letter with his account number on it. EIP is going to come speak with pt.    Morley Kos, LISW - S  Psychiatric Social Worker  [facility] [employer]   [phone]

## 2018-04-01 NOTE — Unmapped (Signed)
Per MD, the pt is homeless and requested to speak with SW.    SW reviewed the pt's chart then met with him in his room (I-4).  SW introduced self and explained role.  Pt accepted a Conservation officer, historic buildings from SW.  Pt reports that he is interested in enrolling in an inpatient substance abuse treatment program.  Pt confirmed that he is agreeable to speaking with [facility] in an effort to accomplish this goal.  EIP [phone]) notified.    SW remains available if needs arise.    Jerilynn Birkenhead MSW, LSW  [employer] Social Worker  [phone]

## 2018-04-01 NOTE — Unmapped (Signed)
ED Attending Attestation Note    Date of service:  04/01/2018    This patient was seen by the advanced practice provider.  I have seen and examined the patient, agree with the workup, evaluation, management and diagnosis.  The care plan has been discussed and I concur.      My assessment reveals a 48 y.o. male with PMH of HLD, HTN and depression who presents requesting help with medication refills.  Patient just recently moved here a week ago from [place] and has been homeless.  He was admitted at [facility] overnight on a statement of belief was cleared this morning.  He states that he does not have any of his medications that he was on a [place] but doesn't remember what medications they were overdoses.  He has no subjective complaints at this time.  Does admit to substance abuse and is interested in rehabilitation programs.    On exam he is well-appearing and in no distress.  Heart sounds are normal.  Lungs are clear.  He moves all 4 extremities equally.

## 2018-04-02 LAB — EKG 12-LEAD
Atrial Rate: 83 {beats}/min
P Axis: 70 degrees
P-R Interval: 160 ms
Q-T Interval: 388 ms
QRS Duration: 78 ms
QTc Calculation (Bazett): 455 ms
R Axis: 60 degrees
T Axis: 34 degrees
Ventricular Rate: 83 {beats}/min

## 2018-04-02 LAB — TROPONIN
Troponin: <0.01 ng/mL (ref <0.01)
Troponin: <0.01 ng/mL (ref <0.01)

## 2018-04-02 LAB — URINALYSIS WITH REFLEX TO CULTURE
Bilirubin Urine: NEGATIVE
Glucose, Ur: NEGATIVE mg/dL
Ketones, Urine: NEGATIVE mg/dL
Leukocyte Esterase, Urine: NEGATIVE
Nitrite, Urine: NEGATIVE
Protein, UA: NEGATIVE mg/dL
Specific Gravity, UA: <=1.005 (ref 1.005–1.030)
Urobilinogen, Urine: 0.2 E.U./dL (ref <2.0)
pH, UA: 6.5 (ref 5.0–8.0)

## 2018-04-02 LAB — URINE DRUG SCREEN
Amphetamine Screen, Urine: POSITIVE — AB
Barbiturate Screen, Ur: NEGATIVE (ref <200)
Benzodiazepine Screen, Urine: NEGATIVE (ref <200)
Cannabinoid Scrn, Ur: NEGATIVE (ref <50)
Cocaine Metabolite Screen, Urine: POSITIVE — AB (ref <300)
Methadone Screen, Urine: NEGATIVE (ref <300)
Opiate Scrn, Ur: NEGATIVE (ref <300)
Oxycodone Urine: NEGATIVE (ref <100)
PCP Screen, Urine: NEGATIVE (ref <25)
Propoxyphene Scrn, Ur: NEGATIVE (ref <300)
pH, UA: 6.5

## 2018-04-02 LAB — MICROSCOPIC URINALYSIS: WBC, UA: NONE SEEN /HPF (ref 0–5)

## 2018-04-02 MED ORDER — LORAZEPAM 2 MG/ML IJ SOLN
2 MG/ML | Freq: Once | INTRAMUSCULAR | Status: DC
Start: 2018-04-02 — End: 2018-04-02

## 2018-04-02 MED ORDER — LORAZEPAM 2 MG/ML IJ SOLN
2 MG/ML | Freq: Once | INTRAMUSCULAR | Status: AC
Start: 2018-04-02 — End: 2018-04-01
  Administered 2018-04-02: 02:00:00 1 mg via INTRAMUSCULAR

## 2018-04-02 MED ORDER — LORAZEPAM 1 MG PO TABS
1 MG | Freq: Once | ORAL | Status: AC
Start: 2018-04-02 — End: 2018-04-01
  Administered 2018-04-02: 01:00:00 2 mg via ORAL

## 2018-04-02 MED ORDER — HALOPERIDOL LACTATE 5 MG/ML IJ SOLN
5 MG/ML | Freq: Once | INTRAMUSCULAR | Status: AC
Start: 2018-04-02 — End: 2018-04-01
  Administered 2018-04-02: 02:00:00 5 mg via INTRAMUSCULAR

## 2018-04-02 MED ORDER — DIPHENHYDRAMINE HCL 50 MG/ML IJ SOLN
50 MG/ML | Freq: Once | INTRAMUSCULAR | Status: AC
Start: 2018-04-02 — End: 2018-04-01
  Administered 2018-04-02: 02:00:00 50 mg via INTRAMUSCULAR

## 2018-04-02 MED FILL — DIPHENHYDRAMINE HCL 50 MG/ML IJ SOLN: 50 mg/mL | INTRAMUSCULAR | Qty: 1

## 2018-04-02 MED FILL — LORAZEPAM 1 MG PO TABS: 1 mg | ORAL | Qty: 2

## 2018-04-02 MED FILL — HALOPERIDOL LACTATE 5 MG/ML IJ SOLN: 5 mg/mL | INTRAMUSCULAR | Qty: 1

## 2018-04-02 MED FILL — LORAZEPAM 2 MG/ML IJ SOLN: 2 mg/mL | INTRAMUSCULAR | Qty: 1

## 2018-04-02 NOTE — Discharge Instructions (Signed)
The Crisis Number for [place], [place] is [phone]. This crisis line is available 24 hours a day, seven days a week.     Substance Abuse Treatment Programs: Please call directly for more information about their specific treatment options.    [facility]  Location: [place], [place]  Phone: [phone]    [facility]  Location: Multiple locations in [place]  Phone: [phone]    [facility]  Location: [place], [place]  Phone: [phone]    [facility]  Location: [place]  Phone: [phone]    [facility]  Location: [address] [place]  Phone: [phone]    [facility]  Location: [address] [place], [place]  Phone: [phone]    [facility]  Location: [address], [place], [place]   Phone: [phone]    [facility]  Location: [address] [place] [address] [place], [place]  Phone: [phone]

## 2018-04-02 NOTE — ED Notes (Signed)
Pt given breakfast tray and drink. Ate 100%. Pt currently sitting in lobby watching TV. Denies any further needs at this time.

## 2018-04-02 NOTE — ED Notes (Signed)
Received e-mail from pt's brother Rob with receipt for a [employer] bus to [place]. Receipt was printed and given to pt.

## 2018-04-02 NOTE — ED Notes (Signed)
Pt continues to sleep soundly in assigned room, respirations even and easy, no signs of distress observed.

## 2018-04-02 NOTE — ED Notes (Signed)
Pt awakened for VS recheck and redraw of troponin.  Pleasant and cooperative.  Allowed EKG to be completed.     Para March, RN  04/02/18 [phone]

## 2018-04-02 NOTE — ED Notes (Signed)
Pt appears to be asleep in treatment room.  Resps even and easy.       Para March, RN  04/02/18 [phone]

## 2018-04-02 NOTE — ED Notes (Signed)
Pt continues to be sitting quietly in chair in [facility].  He has been calm and cooperative thus far.  Will continue to monitor for safety.     Para March, RN  04/02/18 [phone]

## 2018-04-02 NOTE — ED Notes (Signed)
Pt appears sleeping, often snoring loudly.  Will continue to monitor for safety.     Para March, RN  04/02/18 0005

## 2018-04-02 NOTE — ED Notes (Signed)
Level of Care Disposition:   Patient was seen by ED provider and [facility] staff.  The case was presented to psychiatric provider on-call, Dr. Adaline Sill.  Based on the ED evaluation and information presented to the provider by Gabrielle Dare., the decision was made to discharge patient with the following referrals: substance abuse treatment programs.      RATIONALE FOR NON-ADMISSION:  The patient does not meet criteria for an involuntary psychiatric admission, because he is not presenting an imminent risk to self or others and has a plan to follow-up with substance abuse treatment programs in [place].      Pt is Future Oriented AEB: Stated he plans to return home to [place] today and work on getting into an in-pt substance abuse treatment program down there.
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
## 2018-04-02 NOTE — ED Notes (Signed)
Pt is sitting in [facility] chairs watching TV. Paces the area frequently. Breakfast tray ordered.      Mena Pauls, RN  04/02/18 [phone]

## 2018-04-02 NOTE — ED Notes (Signed)
Pt sitting quietly in chairs in [facility] lobby, no signs of distress observed, denies any needs at this time.

## 2018-04-02 NOTE — ED Notes (Signed)
Pt sleeping quietly in assigned treatment room, respirations even and easy, no signs of distress observed.

## 2018-04-02 NOTE — ED Notes (Signed)
Pt continues to be visible in the [facility] lobby. He rotates between sitting calmly in the lobby chairs and pacing. Pt is cooperative and respectful. He has requested the phone to call his family to make sure they will be able to help him get back home to [place]. Pt was given the phone and contacted his older brother Joshua Solomon. Pt was heard discussing how he would get back home during this phone call. Pt remained calm and was observed engaging appropriately in this conversation.     Writer spoke with pt's brother Joshua Solomon who confirmed they would be buying pt a Grey[employer] bus ticket to get back to [place], but the next bus does not leave until 12:45 am tonight (04/03/2018).     Pt talked to his brother for a little while longer. He then requested to turn on the TV and has since been sitting in the [facility] lobby watching TV.

## 2018-04-02 NOTE — ED Notes (Signed)
Breakfast tray and juice provided. Vital signs obtained and recorded in Epic.      Mena Pauls, RN  04/02/18 [phone]

## 2018-04-28 ENCOUNTER — Encounter: Payer: MEDICAID | Attending: Student in an Organized Health Care Education/Training Program

## 2018-04-28 ENCOUNTER — Ambulatory Visit: Payer: MEDICAID

## 2018-04-28 NOTE — Unmapped (Deleted)
Internal Medicine Clinic  [facility]     Subjective:   Jared French is a 48 y.o. male w/ a hx of HTN, HLD    Interval history:  - Came to ED 04/01/18 for medication refill.  - Patient is homeless, recently came here from [facility]    HM   Renal  Diabetes  PHQ  PPSV23  Influenza   Lipid    Medical History:    Family History    Social History    No current outpatient medications on file.     No current facility-administered medications for this visit.        ROS    Objective:     Wt Readings from Last 3 Encounters:   04/01/18 (!) 260 lb (117.9 kg)     BP Readings from Last 5 Encounters:   04/01/18 (!) 161/97   04/01/18 (!) 157/94     There were no vitals taken for this visit.  ***  General:  WDWN, Alert, NAD.   HENT: OP, nares clear. No sinus tenderness. Mucous membranes moist  Eyes: eom intact. PERRL   Neck: Supple, no adenopathy, no JVD, no thyromegaly   CV: RRR, no MGR, pulses 2+, equal.   Lungs: clear to auscultation bilat. Nl effort. No wheezes, crackles, rhonchi.  Abd: Soft, NT, ND, + BS, no HSM.   EXT: no clubbing, no cyanosis, no edema.  Neuro: CN grossly intact, no focal deficits.  AOx3  Skin: no obvious rashes or lesions.    Radiology:   No results found.    Assessment & Plan:      No problem-specific Assessment & Plan notes found for this encounter.    HTN    HLD    Depression/Psychosis     Healthcare maintenance  (47 y.o.,male)     ROUTINE LABORATORY SCREENING   Hepatitis C  No results found for: HCVAB   HgbA1c No results found for: HGBA1C   Hemoglobin No results found for: HGB   PSA No results found for: PSA   LDL No results found for: LDL   HIV No results found for: HIV1X2, HIV1O2AB, LABHIV1   Vitamin D No results found for: VITD25H   TSH No results found for: TSH     HEALTH MAINTENANCE    VACCINES - Flu (all/annual), TDAP (all/q10 yrs), Zostervax (>60), Hep A/B chronic liver dz, HPV (F 11-26, M 13-21).  Pneumovax naive >65 Prevnar then Pneumovax 6-12 mo after.  Pneumovax given > 65  Prevnar after 1  year of administration  Pneumovax given < 65; now >65  Prevnar after 1 year; then pneumovax after 6-12 mo   There is no immunization history on file for this patient.   Colonoscopy (>50, <=q10y) No results found for: HMCOLON  No results found for: HMSIGMOIDOSC   PAP (21-65 q3y): No results found for: HMPAP   Mammogram (50-74 q2y): No results found for: HMMAMMO   DEXA (women aged 3-80) No results found for: HMDEXASCAN   Diabetic Foot Exam (yearly) No results found for: HMDIABFOOTEX   Diabetic Eye Exam (yearly) No results found for: HMDIABEYEEX   AAA screening (male smokers 65-75 and hx smoking)    OAARS reports every 3 months (jan,apr, July, oct)    Folic Acid (0.4-1mg /day women 18-44)     Lung Cancer Screening (55 to 80 years who have a 30 pack-year smoking history and currently smoke or have quit within the past 15 years.)      RTC ***  Lindell Spar, MD  PGY2  04/28/2018  12:00 PM    Visit Orders Placed  No orders of the defined types were placed in this encounter.    Modified Medications    No medications on file     New Prescriptions    No medications on file         Pre-visit planning completed.   Discussed self management and treatment goals with patient.   Barriers to meeting these goals were discussed.   Information regarding the usage and side effects of any new medication were

## 2018-06-02 DIAGNOSIS — F330 Major depressive disorder, recurrent, mild: Secondary | ICD-10-CM

## 2018-06-02 NOTE — ED Notes (Signed)
 Arrives via pov wearing mask stating i'm suicidal; I've been drinking etoh and using cocaine today; I havne't had my meds in over a month. Pt calm in triage. States has been depressed x 1 month; denies having plan.

## 2018-06-02 NOTE — ED Triage Notes (Signed)
Arrives via pov wearing mask stating "i'm suicidal; I've been drinking etoh and using cocaine today; I havne't had my meds in over a month". Pt calm in triage. States has been depressed x 1 month; denies having plan.

## 2018-06-03 ENCOUNTER — Inpatient Hospital Stay
Admit: 2018-06-03 | Discharge: 2018-06-03 | Disposition: A | Discharge status: Home / Self Care | Payer: Self-pay | Attending: Emergency Medicine

## 2018-06-03 LAB — CBC WITH AUTO DIFFERENTIAL
Basophils %: 0 % (ref 0.0–2.0)
Basophils Absolute: 0 10*3/uL (ref 0.0–0.2)
Eosinophils %: 1 % (ref 0.5–7.8)
Eosinophils Absolute: 0.1 10*3/uL (ref 0.0–0.8)
Granulocyte Absolute Count: 0 10*3/uL (ref 0.0–0.5)
Hematocrit: 45 % (ref 41.1–50.3)
Hemoglobin: 15 g/dL (ref 13.6–17.2)
Immature Granulocytes: 0 % (ref 0.0–5.0)
Lymphocytes %: 20 % (ref 13–44)
Lymphocytes Absolute: 2.5 10*3/uL (ref 0.5–4.6)
MCH: 31 PG (ref 26.1–32.9)
MCHC: 33.3 g/dL (ref 31.4–35.0)
MCV: 93 FL (ref 79.6–97.8)
MPV: 11.3 FL (ref 9.4–12.3)
Monocytes %: 11 % (ref 4.0–12.0)
Monocytes Absolute: 1.4 10*3/uL — ABNORMAL HIGH (ref 0.1–1.3)
NRBC Absolute: 0 10*3/uL (ref 0.0–0.2)
Neutrophils %: 68 % (ref 43–78)
Neutrophils Absolute: 8.6 10*3/uL — ABNORMAL HIGH (ref 1.7–8.2)
Platelets: 234 10*3/uL (ref 150–450)
RBC: 4.84 M/uL (ref 4.23–5.6)
RDW: 13.6 % (ref 11.9–14.6)
WBC: 12.6 10*3/uL — ABNORMAL HIGH (ref 4.3–11.1)

## 2018-06-03 LAB — ETHYL ALCOHOL
ALCOHOL(ETHYL),SERUM: 110 MG/DL
Ethyl Alcohol: 110 MG/DL

## 2018-06-03 LAB — COMPREHENSIVE METABOLIC PANEL
ALT: 45 U/L (ref 12–65)
AST: 43 U/L — ABNORMAL HIGH (ref 15–37)
Albumin/Globulin Ratio: 0.9 — ABNORMAL LOW (ref 1.2–3.5)
Albumin: 3.6 g/dL (ref 3.5–5.0)
Alkaline Phosphatase: 106 U/L (ref 50–136)
Anion Gap: 8 mmol/L (ref 7–16)
BUN: 9 MG/DL (ref 6–23)
CO2: 28 mmol/L (ref 21–32)
Calcium: 8.8 MG/DL (ref 8.3–10.4)
Chloride: 104 mmol/L (ref 98–107)
Creatinine: 1.06 MG/DL (ref 0.8–1.5)
EGFR IF NonAfrican American: >60 mL/min/{1.73_m2} (ref >60)
GFR African American: >60 mL/min/{1.73_m2} (ref >60)
Globulin: 3.8 g/dL — ABNORMAL HIGH (ref 2.3–3.5)
Glucose: 127 mg/dL — ABNORMAL HIGH (ref 65–100)
Potassium: 3.8 mmol/L (ref 3.5–5.1)
Sodium: 140 mmol/L (ref 136–145)
Total Bilirubin: 0.3 MG/DL (ref 0.2–1.1)
Total Protein: 7.4 g/dL (ref 6.3–8.2)

## 2018-06-03 LAB — SALICYLATE
Salicylate level: 2.5 MG/DL — ABNORMAL LOW (ref 2.8–20.0)
Salicylate: 2.5 MG/DL — ABNORMAL LOW (ref 2.8–20.0)

## 2018-06-03 LAB — ACETAMINOPHEN LEVEL: Acetaminophen Level: <2 ug/mL — ABNORMAL LOW (ref 10.0–30.0)

## 2018-06-03 LAB — CBC WITH AUTOMATED DIFF
ABS. BASOPHILS: 0 10*3/uL (ref 0.0–0.2)
ABS. EOSINOPHILS: 0.1 10*3/uL (ref 0.0–0.8)
ABS. IMM. GRANS.: 0 10*3/uL (ref 0.0–0.5)
ABS. LYMPHOCYTES: 2.5 10*3/uL (ref 0.5–4.6)
ABS. MONOCYTES: 1.4 10*3/uL — ABNORMAL HIGH (ref 0.1–1.3)
ABS. NEUTROPHILS: 8.6 10*3/uL — ABNORMAL HIGH (ref 1.7–8.2)
ABSOLUTE NRBC: 0 10*3/uL (ref 0.0–0.2)
BASOPHILS: 0 % (ref 0.0–2.0)
EOSINOPHILS: 1 % (ref 0.5–7.8)
HCT: 45 % (ref 41.1–50.3)
HGB: 15 g/dL (ref 13.6–17.2)
IMMATURE GRANULOCYTES: 0 % (ref 0.0–5.0)
LYMPHOCYTES: 20 % (ref 13–44)
MCH: 31 PG (ref 26.1–32.9)
MCHC: 33.3 g/dL (ref 31.4–35.0)
MCV: 93 FL (ref 79.6–97.8)
MONOCYTES: 11 % (ref 4.0–12.0)
MPV: 11.3 FL (ref 9.4–12.3)
NEUTROPHILS: 68 % (ref 43–78)
PLATELET: 234 10*3/uL (ref 150–450)
RBC: 4.84 M/uL (ref 4.23–5.6)
RDW: 13.6 % (ref 11.9–14.6)
WBC: 12.6 10*3/uL — ABNORMAL HIGH (ref 4.3–11.1)

## 2018-06-03 LAB — METABOLIC PANEL, COMPREHENSIVE
A-G Ratio: 0.9 — ABNORMAL LOW (ref 1.2–3.5)
ALT (SGPT): 45 U/L (ref 12–65)
AST (SGOT): 43 U/L — ABNORMAL HIGH (ref 15–37)
Albumin: 3.6 g/dL (ref 3.5–5.0)
Alk. phosphatase: 106 U/L (ref 50–136)
Anion gap: 8 mmol/L (ref 7–16)
BUN: 9 MG/DL (ref 6–23)
Bilirubin, total: 0.3 MG/DL (ref 0.2–1.1)
CO2: 28 mmol/L (ref 21–32)
Calcium: 8.8 MG/DL (ref 8.3–10.4)
Chloride: 104 mmol/L (ref 98–107)
Creatinine: 1.06 MG/DL (ref 0.8–1.5)
GFR est AA: >60 mL/min/{1.73_m2} (ref >60)
GFR est non-AA: >60 mL/min/{1.73_m2} (ref >60)
Globulin: 3.8 g/dL — ABNORMAL HIGH (ref 2.3–3.5)
Glucose: 127 mg/dL — ABNORMAL HIGH (ref 65–100)
Potassium: 3.8 mmol/L (ref 3.5–5.1)
Protein, total: 7.4 g/dL (ref 6.3–8.2)
Sodium: 140 mmol/L (ref 136–145)

## 2018-06-03 LAB — ACETAMINOPHEN: Acetaminophen level: <2 ug/mL — ABNORMAL LOW (ref 10.0–30.0)

## 2018-06-03 MED ORDER — TRAZODONE 100 MG TAB
100 mg | ORAL_TABLET | Freq: Every evening | ORAL | 3 refills | Status: DC
Start: 2018-06-03 — End: 2018-07-20

## 2018-06-03 MED ORDER — RISPERIDONE 0.5 MG TAB
0.5 mg | ORAL_TABLET | ORAL | 3 refills | Status: AC
Start: 2018-06-03 — End: ?

## 2018-06-03 NOTE — Progress Notes (Signed)
Patient states he placed an application in for [facility] in [facility] / would like their contact information.  Gave patient contact information as well as information for  [facility], transitional housing and shelters.  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  He states he is originally from [facility] [place] [facility] but [place] to stay [facility]  Gave patient some bus tickets to use at discharge.  No other needs or concerns / nurse aware.

## 2018-06-03 NOTE — ED Notes (Signed)
I have reviewed discharge instructions with the patient.  The patient verbalized understanding.    Patient left ED via Discharge Method: ambulatory to Home with self.    Opportunity for questions and clarification provided.       Patient given 2 scripts.         To continue your aftercare when you leave the hospital, you may receive an automated call from our care team to check in on how you are doing.  This is a free service and part of our promise to provide the best care and service to meet your aftercare needs." If you have questions, or wish to unsubscribe from this service please call [phone].  Thank you for Choosing our [place] [facility].

## 2018-06-03 NOTE — ED Provider Notes (Signed)
48 year old gentleman with history of major depressive disorder, schizophrenia, polysubstance abuse presents with mild chronic suicidal and homicidal ideation without plan or specific person of intent.    [facility].  Multiple psychiatric evaluations while in those emergency departments found him to be not qualifying for involuntary inpatient placement.    [place] ago.  He has been homeless here and came here to get into a "program" " solutions" or something like that.    He has been out of his medicines for a month or two.    Patient wants to to a Child psychotherapistsocial worker if one is available regarding some other kind of program something "[facility]" or something like that.           No past medical history on file.    No past surgical history on file.      No family history on file.    Social History     Socioeconomic History   ??? Marital status: SINGLE     Spouse name: Not on file   ??? Number of children: Not on file   ??? Years of education: Not on file   ??? Highest education level: Not on file   Occupational History   ??? Not on file   Social Needs   ??? Financial resource strain: Not on file   ??? Food insecurity     Worry: Not on file     Inability: Not on file   ??? Transportation needs     Medical: Not on file     Non-medical: Not on file   Tobacco Use   ??? Smoking status: Current Every Day Smoker   ??? Smokeless tobacco: Never Used   Substance and Sexual Activity   ??? Alcohol use: Yes   ??? Drug use: Yes     Types: Cocaine   ??? Sexual activity: Not on file   Lifestyle   ??? Physical activity     Days per week: Not on file     Minutes per session: Not on file   ??? Stress: Not on file   Relationships   ??? Social Wellsite geologistconnections     Talks on phone: Not on file     Gets together: Not on file     Attends religious service: Not on file     Active member of club or organization: Not on file      Attends meetings of clubs or organizations: Not on file     Relationship status: Not on file   ??? Intimate partner violence     Fear of current or ex partner: Not on file     Emotionally abused: Not on file     Physically abused: Not on file     Forced sexual activity: Not on file   Other Topics Concern   ??? Not on file   Social History Narrative   ??? Not on file         ALLERGIES: Patient has no known allergies.    Review of Systems   Psychiatric/Behavioral: Positive for suicidal ideas. Negative for dysphoric mood.       Vitals:    06/02/18 2342   BP: 148/86   Pulse: (!) 110   Resp: 17   Temp: 98.2 ??F (36.8 ??C)   SpO2: 96%   Weight: 117.9 kg (260 lb)   Height: 6\' 4"  (1.93  m)            Physical Exam  Vitals signs and nursing note reviewed.   Constitutional:       General: He is not in acute distress.     Appearance: Normal appearance. He is well-developed. He is not ill-appearing, toxic-appearing or diaphoretic.   HENT:      Head: Normocephalic and atraumatic.   Eyes:      General:         Right eye: No discharge.         Left eye: No discharge.      Conjunctiva/sclera: Conjunctivae normal.   Neck:      Musculoskeletal: Normal range of motion and neck supple.   Pulmonary:      Effort: Pulmonary effort is normal. No respiratory distress.   Musculoskeletal: Normal range of motion.   Skin:     General: Skin is warm and dry.      Findings: No rash.   Neurological:      General: No focal deficit present.      Mental Status: He is alert and oriented to person, place, and time. Mental status is at baseline.      Motor: No abnormal muscle tone.      Comments: cni 2-12 grossly  Nl gait,  Nl speech     Psychiatric:         Mood and Affect: Mood normal.         Behavior: Behavior normal.          MDM  Number of Diagnoses or Management Options  Diagnosis management comments: Medical decision making note:  Mild chronic suicidal ideations without specific plan  Will review his medicine list from the [facility] emergency department  encounters, refill any uncontrolled substance prescriptions for him.  Provide a list of shelters, as well as the name and number for the mental health center.  May have him come back in the morning to visit with social workers after 9 AM, versus possibly bed him down through the night to see them in the morning, will discuss the strategy with the charge nurse and overnight doctor.  This concludes the "medical decision making note" part of this emergency department visit note.           Procedures

## 2018-06-03 NOTE — ED Notes (Signed)
Unable to urinate at this time.  

## 2018-06-03 NOTE — ED Notes (Signed)
Pt ambulatory with steady gait to restroom and back to ED room without difficulty, resp even and unlabored.

## 2018-06-03 NOTE — ED Notes (Signed)
Report received from Melissa, RN at this time.

## 2018-06-03 NOTE — ED Notes (Signed)
I have reviewed discharge instructions with the patient.  The patient verbalized understanding.    Patient left ED via Discharge Method: ambulatory to Home with self.    Opportunity for questions and clarification provided.       Patient given 2 scripts.         To continue your aftercare when you leave the hospital, you may receive an automated call from our care team to check in on how you are doing.  This is a free service and part of our promise to provide the best care and service to meet your aftercare needs.??? If you have questions, or wish to unsubscribe from this service please call [phone].  Thank you for Choosing our [place] [facility].

## 2018-06-03 NOTE — ED Notes (Signed)
Pt ambulatory with steady gait to restroom and back to ED room without difficulty, resp even and unlabored.

## 2018-06-03 NOTE — ED Notes (Signed)
Unable to urinate at this time

## 2018-06-03 NOTE — ED Provider Notes (Signed)
48-year-old gentleman with history of major depressive disorder, schizophrenia, polysubstance abuse presents with mild chronic suicidal and homicidal ideation without plan or specific person of intent.    Couple recent evaluations at emergency departments [place] for the same complaints.  Multiple psychiatric evaluations while in those emergency departments found him to be not qualifying for involuntary inpatient placement.    Patient migrated from [place] to  [place] roughly a week ago.  He has been homeless here and came here to get into a "program" " solutions" or something like that.    He has been out of his medicines for a month or two.    Patient wants to to a social worker if one is available regarding some other kind of program something "[facility]" or something like that.           No past medical history on file.    No past surgical history on file.      No family history on file.    Social History     Socioeconomic History   ??? Marital status: SINGLE     Spouse name: Not on file   ??? Number of children: Not on file   ??? Years of education: Not on file   ??? Highest education level: Not on file   Occupational History   ??? Not on file   Social Needs   ??? Financial resource strain: Not on file   ??? Food insecurity     Worry: Not on file     Inability: Not on file   ??? Transportation needs     Medical: Not on file     Non-medical: Not on file   Tobacco Use   ??? Smoking status: Current Every Day Smoker   ??? Smokeless tobacco: Never Used   Substance and Sexual Activity   ??? Alcohol use: Yes   ??? Drug use: Yes     Types: Cocaine   ??? Sexual activity: Not on file   Lifestyle   ??? Physical activity     Days per week: Not on file     Minutes per session: Not on file   ??? Stress: Not on file   Relationships   ??? Social connections     Talks on phone: Not on file     Gets together: Not on file     Attends religious service: Not on file     Active member of club or organization: Not on file      Attends meetings of clubs or organizations: Not on file     Relationship status: Not on file   ??? Intimate partner violence     Fear of current or ex partner: Not on file     Emotionally abused: Not on file     Physically abused: Not on file     Forced sexual activity: Not on file   Other Topics Concern   ??? Not on file   Social History Narrative   ??? Not on file         ALLERGIES: Patient has no known allergies.    Review of Systems   Psychiatric/Behavioral: Positive for suicidal ideas. Negative for dysphoric mood.       Vitals:    06/02/18 2342   BP: 148/86   Pulse: (!) 110   Resp: 17   Temp: 98.2 ??F (36.8 ??C)   SpO2: 96%   Weight: 117.9 kg (260 lb)   Height: 6' 4" (1.93   m)            Physical Exam  Vitals signs and nursing note reviewed.   Constitutional:       General: He is not in acute distress.     Appearance: Normal appearance. He is well-developed. He is not ill-appearing, toxic-appearing or diaphoretic.   HENT:      Head: Normocephalic and atraumatic.   Eyes:      General:         Right eye: No discharge.         Left eye: No discharge.      Conjunctiva/sclera: Conjunctivae normal.   Neck:      Musculoskeletal: Normal range of motion and neck supple.   Pulmonary:      Effort: Pulmonary effort is normal. No respiratory distress.   Musculoskeletal: Normal range of motion.   Skin:     General: Skin is warm and dry.      Findings: No rash.   Neurological:      General: No focal deficit present.      Mental Status: He is alert and oriented to person, place, and time. Mental status is at baseline.      Motor: No abnormal muscle tone.      Comments: cni 2-12 grossly  Nl gait,  Nl speech     Psychiatric:         Mood and Affect: Mood normal.         Behavior: Behavior normal.          MDM  Number of Diagnoses or Management Options  Diagnosis management comments: Medical decision making note:  Mild chronic suicidal ideations without specific plan  Will review his medicine list from the [facility] emergency department  encounters, refill any uncontrolled substance prescriptions for him.  Provide a list of shelters, as well as the name and number for the mental health center.  May have him come back in the morning to visit with social workers after 9 AM, versus possibly bed him down through the night to see them in the morning, will discuss the strategy with the charge nurse and overnight doctor.  This concludes the "medical decision making note" part of this emergency department visit note.           Procedures

## 2018-06-03 NOTE — Progress Notes (Signed)
Patient states he placed an application in for [facility] in [place] / would like their contact information.  Gave patient contact information as well as information for  [facility], transitional housing and shelters.  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  He states he is originally from [place] but wants to stay in [place].  Gave patient some bus tickets to use at discharge.  No other needs or concerns / nurse aware.

## 2018-07-17 ENCOUNTER — Inpatient Hospital Stay
Admit: 2018-07-17 | Discharge: 2018-07-20 | Disposition: A | Discharge status: Home / Self Care | Payer: Self-pay | Attending: Emergency Medicine

## 2018-07-17 ENCOUNTER — Emergency Department: Admit: 2018-07-17 | Payer: Self-pay | Primary: Family Medicine

## 2018-07-17 DIAGNOSIS — F418 Other specified anxiety disorders: Secondary | ICD-10-CM

## 2018-07-17 LAB — EKG 12-LEAD
Atrial Rate: 82 {beats}/min
P Axis: 57 degrees
P-R Interval: 154 ms
Q-T Interval: 350 ms
QRS Duration: 80 ms
QTc Calculation (Bazett): 408 ms
R Axis: 45 degrees
T Axis: 7 degrees
Ventricular Rate: 82 {beats}/min

## 2018-07-17 LAB — COMPREHENSIVE METABOLIC PANEL
ALT: 34 U/L (ref 12–65)
AST: 25 U/L (ref 15–37)
Albumin/Globulin Ratio: 0.8 — ABNORMAL LOW (ref 1.2–3.5)
Albumin: 3.2 g/dL — ABNORMAL LOW (ref 3.5–5.0)
Alkaline Phosphatase: 104 U/L (ref 50–136)
Anion Gap: 8 mmol/L (ref 7–16)
BUN: 11 MG/DL (ref 6–23)
CO2: 28 mmol/L (ref 21–32)
Calcium: 8.7 MG/DL (ref 8.3–10.4)
Chloride: 107 mmol/L (ref 98–107)
Creatinine: 1.1 MG/DL (ref 0.8–1.5)
EGFR IF NonAfrican American: >60 mL/min/{1.73_m2} (ref >60)
GFR African American: >60 mL/min/{1.73_m2} (ref >60)
Globulin: 3.9 g/dL — ABNORMAL HIGH (ref 2.3–3.5)
Glucose: 102 mg/dL — ABNORMAL HIGH (ref 65–100)
Potassium: 3.9 mmol/L (ref 3.5–5.1)
Sodium: 143 mmol/L (ref 136–145)
Total Bilirubin: 0.1 MG/DL — ABNORMAL LOW (ref 0.2–1.1)
Total Protein: 7.1 g/dL (ref 6.3–8.2)

## 2018-07-17 LAB — URINALYSIS W/ RFLX MICROSCOPIC
BACTERIA, URINE: 0 /hpf
Bacteria: 0 /hpf
Bilirubin, Urine: NEGATIVE
Bilirubin: NEGATIVE
Casts UA: 0 /lpf
Casts: 0 /lpf
Epithelial Cells, UA: 0 /hpf
Epithelial cells: 0 /hpf
Glucose, Ur: NEGATIVE mg/dL
Glucose: NEGATIVE mg/dL
Ketone: NEGATIVE mg/dL
Ketones, Urine: NEGATIVE mg/dL
Leukocyte Esterase, Urine: NEGATIVE
Leukocyte Esterase: NEGATIVE
Nitrite, Urine: NEGATIVE
Nitrites: NEGATIVE
Protein, UA: NEGATIVE mg/dL
Protein: NEGATIVE mg/dL
RBC, UA: 0 /hpf
RBC: 0 /hpf
Specific Gravity, UA: 1.002 (ref 1.001–1.023)
Specific gravity: 1.002 (ref 1.001–1.023)
Urobilinogen, UA, POCT: 0.2 EU/dL (ref 0.2–1.0)
Urobilinogen: 0.2 EU/dL (ref 0.2–1.0)
WBC, UA: 0 /hpf
WBC: 0 /hpf
pH (UA): 6 (ref 5.0–9.0)
pH, UA: 6 (ref 5.0–9.0)

## 2018-07-17 LAB — CBC
Hematocrit: 43.7 % (ref 41.1–50.3)
Hemoglobin: 14.5 g/dL (ref 13.6–17.2)
MCH: 30.9 PG (ref 26.1–32.9)
MCHC: 33.2 g/dL (ref 31.4–35.0)
MCV: 93 FL (ref 79.6–97.8)
MPV: 11.4 FL (ref 9.4–12.3)
NRBC Absolute: 0 10*3/uL (ref 0.0–0.2)
Platelets: 240 10*3/uL (ref 150–450)
RBC: 4.7 M/uL (ref 4.23–5.6)
RDW: 13.2 % (ref 11.9–14.6)
WBC: 9.9 10*3/uL (ref 4.3–11.1)

## 2018-07-17 LAB — TROPONIN, HIGH SENSITIVITY
Troponin, High Sensitivity: 9 pg/mL (ref 0–14)
Troponin, High Sensitivity: 6.2 pg/mL (ref 0–14)

## 2018-07-17 LAB — DRUG SCREEN, URINE
AMPHETAMINES: NEGATIVE
Amphetamine Screen, Urine: NEGATIVE
BARBITURATES: NEGATIVE
BENZODIAZEPINES: NEGATIVE
Barbiturate Screen, Urine: NEGATIVE
Benzodiazepine Screen, Urine: NEGATIVE
COCAINE: POSITIVE
Cocaine Screen Urine: POSITIVE
METHADONE: NEGATIVE
Methadone Screen, Urine: NEGATIVE
OPIATES: NEGATIVE
Opiate Screen, Urine: NEGATIVE
PCP Screen, Urine: NEGATIVE
PCP(PHENCYCLIDINE): NEGATIVE
THC (TH-CANNABINOL): NEGATIVE
THC Screen, Urine: NEGATIVE

## 2018-07-17 LAB — ETHYL ALCOHOL
ALCOHOL(ETHYL),SERUM: 113 MG/DL
Ethyl Alcohol: 113 MG/DL

## 2018-07-17 LAB — EKG, 12 LEAD, INITIAL
Atrial Rate: 82 {beats}/min
Calculated P Axis: 57 degrees
Calculated R Axis: 45 degrees
Calculated T Axis: 7 degrees
P-R Interval: 154 ms
Q-T Interval: 350 ms
QRS Duration: 80 ms
QTC Calculation (Bezet): 408 ms
Ventricular Rate: 82 {beats}/min

## 2018-07-17 LAB — METABOLIC PANEL, COMPREHENSIVE
A-G Ratio: 0.8 — ABNORMAL LOW (ref 1.2–3.5)
ALT (SGPT): 34 U/L (ref 12–65)
AST (SGOT): 25 U/L (ref 15–37)
Albumin: 3.2 g/dL — ABNORMAL LOW (ref 3.5–5.0)
Alk. phosphatase: 104 U/L (ref 50–136)
Anion gap: 8 mmol/L (ref 7–16)
BUN: 11 MG/DL (ref 6–23)
Bilirubin, total: 0.1 MG/DL — ABNORMAL LOW (ref 0.2–1.1)
CO2: 28 mmol/L (ref 21–32)
Calcium: 8.7 MG/DL (ref 8.3–10.4)
Chloride: 107 mmol/L (ref 98–107)
Creatinine: 1.1 MG/DL (ref 0.8–1.5)
GFR est AA: >60 mL/min/{1.73_m2} (ref >60)
GFR est non-AA: >60 mL/min/{1.73_m2} (ref >60)
Globulin: 3.9 g/dL — ABNORMAL HIGH (ref 2.3–3.5)
Glucose: 102 mg/dL — ABNORMAL HIGH (ref 65–100)
Potassium: 3.9 mmol/L (ref 3.5–5.1)
Protein, total: 7.1 g/dL (ref 6.3–8.2)
Sodium: 143 mmol/L (ref 136–145)

## 2018-07-17 LAB — CBC W/O DIFF
ABSOLUTE NRBC: 0 10*3/uL (ref 0.0–0.2)
HCT: 43.7 % (ref 41.1–50.3)
HGB: 14.5 g/dL (ref 13.6–17.2)
MCH: 30.9 PG (ref 26.1–32.9)
MCHC: 33.2 g/dL (ref 31.4–35.0)
MCV: 93 FL (ref 79.6–97.8)
MPV: 11.4 FL (ref 9.4–12.3)
PLATELET: 240 10*3/uL (ref 150–450)
RBC: 4.7 M/uL (ref 4.23–5.6)
RDW: 13.2 % (ref 11.9–14.6)
WBC: 9.9 10*3/uL (ref 4.3–11.1)

## 2018-07-17 LAB — TROPONIN-HIGH SENSITIVITY
Troponin-High Sensitivity: 9 pg/mL (ref 0–14)
Troponin-High Sensitivity: 6.2 pg/mL (ref 0–14)

## 2018-07-17 MED ORDER — SERTRALINE 25 MG TAB
25 mg | Freq: Every day | ORAL | Status: DC
Start: 2018-07-17 — End: 2018-07-20
  Administered 2018-07-18 – 2018-07-20 (×4): via ORAL

## 2018-07-17 MED ORDER — SUCRALFATE 1 GRAM TAB
1 gram | Freq: Once | ORAL | Status: AC
Start: 2018-07-17 — End: 2018-07-17
  Administered 2018-07-17: 16:00:00 via ORAL

## 2018-07-17 MED ORDER — FAMOTIDINE (PF) 20 MG/2 ML IV
202 mg/2 mL | INTRAVENOUS | Status: AC
Start: 2018-07-17 — End: 2018-07-17
  Administered 2018-07-17: 16:00:00 via INTRAVENOUS

## 2018-07-17 MED ORDER — RISPERIDONE 0.5 MG TAB
0.5 mg | ORAL | Status: AC
Start: 2018-07-17 — End: 2018-07-17
  Administered 2018-07-17: 18:00:00 via ORAL

## 2018-07-17 MED ORDER — RISPERIDONE 1 MG TAB
1 mg | Freq: Every evening | ORAL | Status: DC
Start: 2018-07-17 — End: 2018-07-20
  Administered 2018-07-18 – 2018-07-20 (×3): via ORAL

## 2018-07-17 MED FILL — RISPERIDONE 0.5 MG TAB: 0.5 mg | ORAL | Qty: 1

## 2018-07-17 MED FILL — FAMOTIDINE (PF) 20 MG/2 ML IV: 20 mg/2 mL | INTRAVENOUS | Qty: 4

## 2018-07-17 MED FILL — SUCRALFATE 1 GRAM TAB: 1 gram | ORAL | Qty: 1

## 2018-07-17 NOTE — ED Provider Notes (Signed)
48 year old who is a frequent utilizer of emergency departments, local psychiatric services, and illicit drugs presenting for chest pain, "depression", "homicidal thinking" and states that he has been unable to get his medications.  Patient's been seen multiple times in multiple locations for the symptoms.  Feels that he needs to be placed in an inpatient facility so that he can get his meds and get the voices to go away.  He has no specific plan.    The history is provided by the patient.   Chest Pain (Angina)    This is a recurrent problem. The current episode started more than 2 days ago.        No past medical history on file.    No past surgical history on file.      No family history on file.    Social History     Socioeconomic History   ??? Marital status: SINGLE     Spouse name: Not on file   ??? Number of children: Not on file   ??? Years of education: Not on file   ??? Highest education level: Not on file   Occupational History   ??? Not on file   Social Needs   ??? Financial resource strain: Not on file   ??? Food insecurity     Worry: Not on file     Inability: Not on file   ??? Transportation needs     Medical: Not on file     Non-medical: Not on file   Tobacco Use   ??? Smoking status: Current Every Day Smoker   ??? Smokeless tobacco: Never Used   Substance and Sexual Activity   ??? Alcohol use: Yes   ??? Drug use: Yes     Types: Cocaine   ??? Sexual activity: Not on file   Lifestyle   ??? Physical activity     Days per week: Not on file     Minutes per session: Not on file   ??? Stress: Not on file   Relationships   ??? Social Product manager on phone: Not on file     Gets together: Not on file     Attends religious service: Not on file     Active member of club or organization: Not on file     Attends meetings of clubs or organizations: Not on file     Relationship status: Not on file   ??? Intimate partner violence     Fear of current or ex partner: Not on file     Emotionally abused: Not on file     Physically abused: Not on  file     Forced sexual activity: Not on file   Other Topics Concern   ??? Not on file   Social History Narrative   ??? Not on file         ALLERGIES: Patient has no known allergies.    Review of Systems   Cardiovascular: Positive for chest pain.   Psychiatric/Behavioral: Positive for behavioral problems, decreased concentration, dysphoric mood, hallucinations, self-injury, sleep disturbance and suicidal ideas.   All other systems reviewed and are negative.      Vitals:    07/17/18 1027   BP: 135/76   Pulse: 88   Resp: 16   Temp: 97.9 ??F (36.6 ??C)   SpO2: 99%   Weight: 117.9 kg (260 lb)   Height: 6' 5"  (1.956 m)  Physical Exam  Vitals signs and nursing note reviewed.   Constitutional:       Appearance: He is well-developed.   HENT:      Head: Normocephalic and atraumatic.   Eyes:      Conjunctiva/sclera: Conjunctivae normal.      Pupils: Pupils are equal, round, and reactive to light.   Neck:      Musculoskeletal: Normal range of motion and neck supple.   Cardiovascular:      Rate and Rhythm: Normal rate and regular rhythm.      Heart sounds: Normal heart sounds.   Pulmonary:      Effort: Pulmonary effort is normal.      Breath sounds: Normal breath sounds.   Abdominal:      General: Bowel sounds are normal.      Palpations: Abdomen is soft.   Musculoskeletal: Normal range of motion.         General: No deformity.   Skin:     General: Skin is warm and dry.   Neurological:      Mental Status: He is alert and oriented to person, place, and time.      Cranial Nerves: No cranial nerve deficit.   Psychiatric:         Mood and Affect: Affect is flat.         Speech: Speech normal.         Behavior: Behavior normal.         Cognition and Memory: Cognition normal.          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  Concern for substance induced psychosis, schizoaffective schizophrenia, malingering    [facility] Psychiatric RECHECK NOTE for 07/19/2018  Arrival Date/Time: 07/17/2018 10:21 AM     Joshua Solomon  MRN: [id number]   Date of Birth: Apr 18, 1970   48 y.o. male   [facility] EMERGENCY DEPT ER15/15  Seen on 07/19/2018 @ 4:45 PM      he is on papers. Commitment Papers expire on: 07/20/2018  he has completed a tele-psych evaluation. 07/17/2018    Joshua Solomon is a 48 y.o. male here for psychotic thinking, substance abuse, undomiciled.    Objective:  ------------------------------------------------------------               07/17/18   07/17/18   07/17/18      07/18/18                      1631       2011       2223          0811           ------------------------------------------------------------     BP:        107/66     124/83   (!) 147/93      123/85           Pulse:       83         72         65            71             Resp:  17         18            16              Temp:                                     97.5 ??F (36.4 ??C)     SpO2:       96%        98%        97%           100%           ------------------------------------------------------------     Recent Labs:                 07/17/18                       1034          NA           143           K            3.9           CL           107           CO2          28            BUN          11            CREA         1.10          CA           8.7           GLU          102*           No lab exists for component: UDS,  MBA    Current Facility-Administered Medications:  sertraline (ZOLOFT) tablet 50 mg, 50 mg, Oral, DAILY  risperiDONE (RisperDAL) tablet 1 mg, 1 mg, Oral, QHS    Current Outpatient Medications:  risperiDONE (RisperDAL) 0.5 mg tablet, Take one each morning, and two each evening  traZODone (DESYREL) 100 mg tablet, Take 1 Tab by mouth nightly.        Assessment and Plan:  48 year old male longstanding  history of psychiatric illness and substance abuse.  Schizoaffective disorder  Patient is being held secondary to auditory hallucinations and concern for somewhat vague homicidal ideations.  He has remained calm and cooperative throughout his time here in the ER.  Tolerating medications.  Psychiatric re-eval tomorrow   Ky Barban, MD; 07/19/2018 @4 :45 PM ===============         Amount and/or Complexity of Data Reviewed  Clinical lab tests: ordered and reviewed (Results for orders placed or performed during the hospital encounter of 07/17/18  -CBC W/O DIFF       Result                      Value             Ref Range           WBC  9.9               4.3 - 11.1 K*       RBC                         4.70              4.23 - 5.6 M*       HGB                         14.5              13.6 - 17.2 *       HCT                         43.7              41.1 - 50.3 %       MCV                         93.0              79.6 - 97.8 *       MCH                         30.9              26.1 - 32.9 *       MCHC                        33.2              31.4 - 35.0 *       RDW                         13.2              11.9 - 14.6 %       PLATELET                    240               150 - 450 K/*       MPV                         11.4              9.4 - 12.3 FL       ABSOLUTE NRBC               0.00              0.0 - 0.2 K/*  -METABOLIC PANEL, COMPREHENSIVE       Result                      Value             Ref Range           Sodium                      143               136 - 145 mm*       Potassium  3.9               3.5 - 5.1 mm*       Chloride                    107               98 - 107 mmo*       CO2                         28                21 - 32 mmol*       Anion gap                   8                 7 - 16 mmol/L       Glucose                     102 (H)           65 - 100 mg/*       BUN                         11                6 - 23 MG/DL        Creatinine                   1.10              0.8 - 1.5 MG*       GFR est AA                  >60               >60 ml/min/1*       GFR est non-AA              >60               >60 ml/min/1*       Calcium                     8.7               8.3 - 10.4 M*       Bilirubin, total            0.1 (L)           0.2 - 1.1 MG*       ALT (SGPT)                  34                12 - 65 U/L         AST (SGOT)                  25                15 - 37 U/L         Alk. phosphatase            104               50 - 136 U/L  Protein, total              7.1               6.3 - 8.2 g/*       Albumin                     3.2 (L)           3.5 - 5.0 g/*       Globulin                    3.9 (H)           2.3 - 3.5 g/*       A-G Ratio                   0.8 (L)           1.2 - 3.5      -TROPONIN-HIGH SENSITIVITY       Result                      Value             Ref Range           Troponin-High Sensitiv*     9.0               0 - 14 pg/mL   -URINALYSIS W/ RFLX MICROSCOPIC       Result                      Value             Ref Range           Color                       YELLOW                                Appearance                  CLEAR                                 Specific gravity            1.002             1.001 - 1.02*       pH (UA)                     6.0               5.0 - 9.0           Protein                     Negative          NEG mg/dL           Glucose                     Negative          mg/dL               Ketone  Negative          NEG mg/dL           Bilirubin                   Negative          NEG                 Blood                       TRACE (A)         NEG                 Urobilinogen                0.2               0.2 - 1.0 EU*       Nitrites                    Negative          NEG                 Leukocyte Esterase          Negative          NEG                 WBC                         0                 0 /hpf              RBC                         0                 0 /hpf               Epithelial cells            0                 0 /hpf              Bacteria                    0                 0 /hpf              Casts                       0                 0 /lpf         -DRUG SCREEN, URINE       Result                      Value             Ref Range           PCP(PHENCYCLIDINE)          Negative  BENZODIAZEPINES             Negative                              COCAINE                     Positive                              AMPHETAMINES                Negative                              METHADONE                   Negative                              THC (TH-CANNABINOL)         Negative                              OPIATES                     Negative                              BARBITURATES                Negative                         -ETHYL ALCOHOL       Result                      Value             Ref Range           ALCOHOL(ETHYL),SERUM        113               MG/DL          -TROPONIN-HIGH SENSITIVITY       Result                      Value             Ref Range           Troponin-High Sensitiv*     6.2               0 - 14 pg/mL   -EKG, 12 LEAD, INITIAL       Result                      Value             Ref Range           Ventricular Rate            82                BPM                 Atrial Rate  82                BPM                 P-R Interval                154               ms                  QRS Duration                80                ms                  Q-T Interval                350               ms                  QTC Calculation (Bezet)     408               ms                  Calculated P Axis           57                degrees             Calculated R Axis           45                degrees             Calculated T Axis           7                 degrees             Diagnosis                                                     !! AGE AND GENDER SPECIFIC ECG ANALYSIS !!   Normal sinus rhythm   Normal  ECG   When compared with ECG of 18-Aug-2017 18:17,   No significant change was found   Confirmed by [facility]  MD ([facility]), MATTHEW G ([id number]) on 07/17/2018 10:54:16 AM     )  Tests in the radiology section of CPT??: ordered and reviewed (Xr Chest Port    Result Date: 07/17/2018  AP chest radiograph History: Chest pain, Comparison: Chest radiograph August 18, 2017 Findings:   Normal cardiomediastinal silhouette.   Mild subsegmental atelectasis bilateral lung bases.  No evidence of pneumothorax, pleural effusion, or air space consolidation.  Visualized soft tissue and osseous structures otherwise unremarkable.      Impression:  No significant interval change.     )  Discuss the patient with other providers: yes (Psychiatrist evaluated who believes the patient needs admission for stabilization)    Risk of Complications, Morbidity, and/or Mortality  Presenting problems: high  Diagnostic procedures: high  Management options: high  General comments: Elements of this note have been dictated via voice recognition software.  Text and phrases may be  limited by the accuracy of the software.  The chart has been reviewed, but errors may still be present.      Patient Progress  Patient progress: stable    ED Course as of Jul 17 1815   Sun Jul 17, 2018   1257 Patient states he is feeling somewhat better.    [JS]   5361 Patient presents to the emergency department quite often for these kinds complaints.  He is often evaluated, monitored and then is discharged.    [JS]   4431 Patient's troponins are negative.  Work-up is unremarkable.  Patient has gotten his risperidone.  This seems to be a chronic presentation for him.    [JS]   5400 Further evaluation of the patient's medical record reveals he actually is from all over [place].  Patient has notes in [place], [place], [place].  Given that it is Sunday, no social work, and the patient has no support network here in [place] we will request evaluation by tele-psychiatry     [JS]   1546 All that being said, the patient remains cooperative and interactive.  I see no evidence of psychosis.  My suspicion remains that he is looking for shelter access to his medications.    [JS]      ED Course User Index  [JS] Synetta Fail, MD       Procedures

## 2018-07-17 NOTE — ED Notes (Signed)
Pt resting in bed, respirations even and unlabored. Pt swallowed medications with no issues and denies any needs at this time. Sitter at bedside.

## 2018-07-17 NOTE — ED Notes (Signed)
Pt resting in bed with eyes closed, respirations even and unlabored. Sitter at bedside.

## 2018-07-17 NOTE — ED Notes (Signed)
Report recived from April, RN for transfer of care at this time.

## 2018-07-17 NOTE — ED Notes (Signed)
 [place] [facility] [place].Pt is homeless.Pt states last night he drank etoh (beer)and smoked cocaine.Pt states he started having cp. Pt states last night he did vomit. Pt is a/o x 4.Pt ambulatory with steady gait. Pt received 324mg  asa and 1SL NTG en route. Pt states pain has improved and now rates 5/10. Pt states he is hearing voices and states he is experiencing si/hi. Pt states voices telling him to shoot a motherfucker in the headthe patient denies plan to harm himself. Pt states this sometimes happens after using cocaine. Pt very cooperative during triage.Pt wearing a mask.

## 2018-07-17 NOTE — ED Notes (Signed)
Report given to Jordan,RN to assume patient care at this time.

## 2018-07-17 NOTE — ED Notes (Signed)
 Patient Safety Attendant Yes - Name: Britta Mccreedy   Patient Safety Attendant Oriented YES   High risk patients are in line of sight at all times Yes   Excess equipment/medical supplies not necessary for the care of the patient removed Yes   All sharp or dangerous objects are removed from room: including but not limited to belts, pens & pencils, needles, medications, cosmetics, lighters, matches, nail files, watches, necklaces, glass objects, razors, razor blades, knives, aerosol sprays, drawstring pants, shoes, cords (telephone, call bells, etc.) cleaning wipes or other cleaning items, aluminum cans, not permanently attached wall dcor Yes   Telephone/cell phone removed as well as TV remote (batteries can be swallowed) Yes   Patient belongings removed and labeled at nurses station Yes   Excess linen is removed from room Yes   All plastic bags are removed from the room and replaced with paper trash bags Yes   Patient is in gown and using hospital socks with rubber soles Yes   No metal, hard eating utensils or hard plates are on meal tray Yes   Remove all cleaning agents used by [employer] Yes   Ensure bathroom door key is easily accessible Yes   If Crucifix is hanging on a nail, remove Crucifix as well as the nail Yes       *If any question above is answered "No," documentation is required.

## 2018-07-17 NOTE — ED Notes (Signed)
Report given to [place], RN for transfer of care at this time

## 2018-07-17 NOTE — ED Notes (Signed)
Patient Safety Attendant Yes - Name: [name]   Patient Safety Attendant Oriented YES   High risk patients are in line of sight at all times Yes   Excess equipment/medical supplies not necessary for the care of the patient removed Yes   All sharp or dangerous objects are removed from room: including but not limited to belts, pens & pencils, needles, medications, cosmetics, lighters, matches, nail files, watches, necklaces, glass objects, razors, razor blades, knives, aerosol sprays, drawstring pants, shoes, cords (telephone, call bells, etc.) cleaning wipes or other cleaning items, aluminum cans, not permanently attached wall d??cor Yes   Telephone/cell phone removed as well as TV remote (batteries can be swallowed) Yes   Patient belongings removed and labeled at nurses station Yes   Excess linen is removed from room Yes   All plastic bags are removed from the room and replaced with paper trash bags Yes   Patient is in gown and using hospital socks with rubber soles Yes   No metal, hard eating utensils or hard plates are on meal tray Yes   Remove all cleaning agents used by [employer] Yes   Ensure bathroom door key is easily accessible Yes   If Crucifix is hanging on a nail, remove Crucifix as well as the nail Yes       *If any question above is answered "No," documentation is required.

## 2018-07-17 NOTE — ED Notes (Signed)
Report recived from April, RN for transfer of care at this time.

## 2018-07-17 NOTE — ED Triage Notes (Signed)
Pt [place] [facility] from the streets.Pt is homeless.Pt states last night he drank etoh (beer)and smoked cocaine.Pt states he started having cp. Pt states last night he did vomit. Pt is a/o x 4.Pt ambulatory with steady gait. Pt received 324mg asa and 1SL NTG en route. Pt states pain has improved and now rates 5/10. Pt states he is hearing voices and states he is experiencing si/hi. Pt states voices telling him to "shoot a motherfucker in the head"the patient denies plan to harm himself. Pt states this sometimes happens after using cocaine. Pt very cooperative during triage.Pt wearing a mask.

## 2018-07-17 NOTE — ED Provider Notes (Addendum)
48 year old who is a frequent utilizer of emergency departments, local psychiatric services, and illicit drugs presenting for chest pain, "depression", "homicidal thinking" and states that he has been unable to get his medications.  Patient's been seen multiple times in multiple locations for the symptoms.  Feels that he needs to be placed in an inpatient facility so that he can get his meds and get the voices to go away.  He has no specific plan.    The history is provided by the patient.   Chest Pain (Angina)    This is a recurrent problem. The current episode started more than 2 days ago.        No past medical history on file.    No past surgical history on file.      No family history on file.    Social History     Socioeconomic History   ??? Marital status: SINGLE     Spouse name: Not on file   ??? Number of children: Not on file   ??? Years of education: Not on file   ??? Highest education level: Not on file   Occupational History   ??? Not on file   Social Needs   ??? Financial resource strain: Not on file   ??? Food insecurity     Worry: Not on file     Inability: Not on file   ??? Transportation needs     Medical: Not on file     Non-medical: Not on file   Tobacco Use   ??? Smoking status: Current Every Day Smoker   ??? Smokeless tobacco: Never Used   Substance and Sexual Activity   ??? Alcohol use: Yes   ??? Drug use: Yes     Types: Cocaine   ??? Sexual activity: Not on file   Lifestyle   ??? Physical activity     Days per week: Not on file     Minutes per session: Not on file   ??? Stress: Not on file   Relationships   ??? Social Product manager on phone: Not on file     Gets together: Not on file     Attends religious service: Not on file     Active member of club or organization: Not on file     Attends meetings of clubs or organizations: Not on file     Relationship status: Not on file   ??? Intimate partner violence     Fear of current or ex partner: Not on file     Emotionally abused: Not on file      Physically abused: Not on file     Forced sexual activity: Not on file   Other Topics Concern   ??? Not on file   Social History Narrative   ??? Not on file         ALLERGIES: Patient has no known allergies.    Review of Systems   Cardiovascular: Positive for chest pain.   Psychiatric/Behavioral: Positive for behavioral problems, decreased concentration, dysphoric mood, hallucinations, self-injury, sleep disturbance and suicidal ideas.   All other systems reviewed and are negative.      Vitals:    07/17/18 1027   BP: 135/76   Pulse: 88   Resp: 16   Temp: 97.9 ??F (36.6 ??C)   SpO2: 99%   Weight: 117.9 kg (260 lb)   Height: 6' 5"  (1.956 m)  Physical Exam  Vitals signs and nursing note reviewed.   Constitutional:       Appearance: He is well-developed.   HENT:      Head: Normocephalic and atraumatic.   Eyes:      Conjunctiva/sclera: Conjunctivae normal.      Pupils: Pupils are equal, round, and reactive to light.   Neck:      Musculoskeletal: Normal range of motion and neck supple.   Cardiovascular:      Rate and Rhythm: Normal rate and regular rhythm.      Heart sounds: Normal heart sounds.   Pulmonary:      Effort: Pulmonary effort is normal.      Breath sounds: Normal breath sounds.   Abdominal:      General: Bowel sounds are normal.      Palpations: Abdomen is soft.   Musculoskeletal: Normal range of motion.         General: No deformity.   Skin:     General: Skin is warm and dry.   Neurological:      Mental Status: He is alert and oriented to person, place, and time.      Cranial Nerves: No cranial nerve deficit.   Psychiatric:         Mood and Affect: Affect is flat.         Speech: Speech normal.         Behavior: Behavior normal.         Cognition and Memory: Cognition normal.          MDM  Number of Diagnoses or Management Options  Anxiety associated with depression: established and worsening  Cocaine abuse ([place]): established and worsening   Diagnosis management comments: 48 year old male presenting for thoughts of aggression, hearing voices, cocaine abuse.  Concern for substance induced psychosis, schizoaffective schizophrenia, malingering    [facility] Psychiatric RECHECK NOTE for 07/19/2018  Arrival Date/Time: 07/17/2018 10:21 AM     Roger Kettles  MRN: [id number]   Date of Birth: 1970/12/14   48 y.o. male   [facility] EMERGENCY DEPT ER15/15  Seen on 07/19/2018 @ 4:45 PM      he is on papers. Commitment Papers expire on: 07/20/2018  he has completed a tele-psych evaluation. 07/17/2018    Aimar Miller is a 48 y.o. male here for psychotic thinking, substance abuse, undomiciled.    Objective:  ------------------------------------------------------------               07/17/18   07/17/18   07/17/18      07/18/18                      1631       2011       2223          0811           ------------------------------------------------------------     BP:        107/66     124/83   (!) 147/93      123/85           Pulse:       83         72         65            71             Resp:  17         18            16              Temp:                                     97.5 ??F (36.4 ??C)     SpO2:       96%        98%        97%           100%           ------------------------------------------------------------     Recent Labs:                 07/17/18                       1034          NA           143           K            3.9           CL           107           CO2          28            BUN          11            CREA         1.10          CA           8.7           GLU          102*           No lab exists for component: UDS,  MBA    Current Facility-Administered Medications:  sertraline (ZOLOFT) tablet 50 mg, 50 mg, Oral, DAILY  risperiDONE (RisperDAL) tablet 1 mg, 1 mg, Oral, QHS    Current Outpatient Medications:  risperiDONE (RisperDAL) 0.5 mg tablet, Take one each morning, and two each evening   traZODone (DESYREL) 100 mg tablet, Take 1 Tab by mouth nightly.        Assessment and Plan:  48 year old male longstanding history of psychiatric illness and substance abuse.  Schizoaffective disorder  Patient is being held secondary to auditory hallucinations and concern for somewhat vague homicidal ideations.  He has remained calm and cooperative throughout his time here in the ER.  Tolerating medications.  Psychiatric re-eval tomorrow   Ky Barban, MD; 07/19/2018 @4 :45 PM ===============         Amount and/or Complexity of Data Reviewed  Clinical lab tests: ordered and reviewed (Results for orders placed or performed during the hospital encounter of 07/17/18  -CBC W/O DIFF       Result                      Value             Ref Range           WBC  9.9               4.3 - 11.1 K*       RBC                         4.70              4.23 - 5.6 M*       HGB                         14.5              13.6 - 17.2 *       HCT                         43.7              41.1 - 50.3 %       MCV                         93.0              79.6 - 97.8 *       MCH                         30.9              26.1 - 32.9 *       MCHC                        33.2              31.4 - 35.0 *       RDW                         13.2              11.9 - 14.6 %       PLATELET                    240               150 - 450 K/*       MPV                         11.4              9.4 - 12.3 FL       ABSOLUTE NRBC               0.00              0.0 - 0.2 K/*  -METABOLIC PANEL, COMPREHENSIVE       Result                      Value             Ref Range           Sodium                      143               136 - 145 mm*       Potassium  3.9               3.5 - 5.1 mm*       Chloride                    107               98 - 107 mmo*       CO2                         28                21 - 32 mmol*       Anion gap                   8                 7 - 16 mmol/L        Glucose                     102 (H)           65 - 100 mg/*       BUN                         11                6 - 23 MG/DL        Creatinine                  1.10              0.8 - 1.5 MG*       GFR est AA                  >60               >60 ml/min/1*       GFR est non-AA              >60               >60 ml/min/1*       Calcium                     8.7               8.3 - 10.4 M*       Bilirubin, total            0.1 (L)           0.2 - 1.1 MG*       ALT (SGPT)                  34                12 - 65 U/L         AST (SGOT)                  25                15 - 37 U/L         Alk. phosphatase            104               50 - 136 U/L  Protein, total              7.1               6.3 - 8.2 g/*       Albumin                     3.2 (L)           3.5 - 5.0 g/*       Globulin                    3.9 (H)           2.3 - 3.5 g/*       A-G Ratio                   0.8 (L)           1.2 - 3.5      -TROPONIN-HIGH SENSITIVITY       Result                      Value             Ref Range           Troponin-High Sensitiv*     9.0               0 - 14 pg/mL   -URINALYSIS W/ RFLX MICROSCOPIC       Result                      Value             Ref Range           Color                       YELLOW                                Appearance                  CLEAR                                 Specific gravity            1.002             1.001 - 1.02*       pH (UA)                     6.0               5.0 - 9.0           Protein                     Negative          NEG mg/dL           Glucose                     Negative          mg/dL               Ketone  Negative          NEG mg/dL           Bilirubin                   Negative          NEG                 Blood                       TRACE (A)         NEG                 Urobilinogen                0.2               0.2 - 1.0 EU*       Nitrites                    Negative          NEG                  Leukocyte Esterase          Negative          NEG                 WBC                         0                 0 /hpf              RBC                         0                 0 /hpf              Epithelial cells            0                 0 /hpf              Bacteria                    0                 0 /hpf              Casts                       0                 0 /lpf         -DRUG SCREEN, URINE       Result                      Value             Ref Range           PCP(PHENCYCLIDINE)          Negative  BENZODIAZEPINES             Negative                              COCAINE                     Positive                              AMPHETAMINES                Negative                              METHADONE                   Negative                              THC (TH-CANNABINOL)         Negative                              OPIATES                     Negative                              BARBITURATES                Negative                         -ETHYL ALCOHOL       Result                      Value             Ref Range           ALCOHOL(ETHYL),SERUM        113               MG/DL          -TROPONIN-HIGH SENSITIVITY       Result                      Value             Ref Range           Troponin-High Sensitiv*     6.2               0 - 14 pg/mL   -EKG, 12 LEAD, INITIAL       Result                      Value             Ref Range           Ventricular Rate            82                BPM                 Atrial Rate  82                BPM                 P-R Interval                154               ms                  QRS Duration                80                ms                  Q-T Interval                350               ms                  QTC Calculation (Bezet)     408               ms                  Calculated P Axis           57                degrees             Calculated R Axis           45                degrees              Calculated T Axis           7                 degrees             Diagnosis                                                     !! AGE AND GENDER SPECIFIC ECG ANALYSIS !!   Normal sinus rhythm   Normal ECG   When compared with ECG of 18-Aug-2017 18:17,   No significant change was found   Confirmed by [facility]  MD (UC), MATTHEW G ([id number]) on 07/17/2018 10:54:16 AM     )  Tests in the radiology section of CPT??: ordered and reviewed (Xr Chest Port    Result Date: 07/17/2018  AP chest radiograph History: Chest pain, Comparison: Chest radiograph August 18, 2017 Findings:   Normal cardiomediastinal silhouette.   Mild subsegmental atelectasis bilateral lung bases.  No evidence of pneumothorax, pleural effusion, or air space consolidation.  Visualized soft tissue and osseous structures otherwise unremarkable.      Impression:  No significant interval change.     )  Discuss the patient with other providers: yes (Psychiatrist evaluated who believes the patient needs admission for stabilization)    Risk of Complications, Morbidity, and/or Mortality  Presenting problems: high  Diagnostic procedures: high  Management options: high  General comments: Elements of this note have been dictated via voice recognition software.  Text and phrases may be  limited by the accuracy of the software.  The chart has been reviewed, but errors may still be present.      Patient Progress  Patient progress: stable    ED Course as of Jul 17 1815   Sun Jul 17, 2018   1257 Patient states he is feeling somewhat better.    [JS]   3710 Patient presents to the emergency department quite often for these kinds complaints.  He is often evaluated, monitored and then is discharged.    [JS]   6269 Patient's troponins are negative.  Work-up is unremarkable.  Patient has gotten his risperidone.  This seems to be a chronic presentation for him.    [JS]   4854 Further evaluation of the patient's medical record reveals he  actually is from all over [place].  Patient has notes in [place], [place], [place].  Given that it is Sunday, no social work, and the patient has no support network here in [place] we will request evaluation by tele-psychiatry    [JS]   1546 All that being said, the patient remains cooperative and interactive.  I see no evidence of psychosis.  My suspicion remains that he is looking for shelter access to his medications.    [JS]      ED Course User Index  [JS] Synetta Fail, MD       Procedures

## 2018-07-17 NOTE — ED Notes (Signed)
Report given to Christina, RN for transfer of care at this time

## 2018-07-17 NOTE — ED Notes (Signed)
Pt resting in bed, respirations even and unlabored. Pt swallowed medications with no issues and denies any needs at this time. Sitter at bedside.

## 2018-07-18 MED FILL — SERTRALINE 25 MG TAB: 25 mg | ORAL | Qty: 2

## 2018-07-18 MED FILL — RISPERIDONE 1 MG TAB: 1 mg | ORAL | Qty: 1

## 2018-07-18 NOTE — ED Notes (Signed)
Care assumed

## 2018-07-18 NOTE — ED Notes (Signed)
Patient resting in bed with eyes closed. Respirations present. No distress noted at this time. Sitter at bedside per policy.

## 2018-07-18 NOTE — ED Notes (Signed)
 Patient Safety Attendant Yes - Name: Joshua Solomon   Patient Safety Attendant Oriented YES   High risk patients are in line of sight at all times Yes   Excess equipment/medical supplies not necessary for the care of the patient removed Yes   All sharp or dangerous objects are removed from room: including but not limited to belts, pens & pencils, needles, medications, cosmetics, lighters, matches, nail files, watches, necklaces, glass objects, razors, razor blades, knives, aerosol sprays, drawstring pants, shoes, cords (telephone, call bells, etc.) cleaning wipes or other cleaning items, aluminum cans, not permanently attached wall dcor Yes   Telephone/cell phone removed as well as TV remote (batteries can be swallowed) Yes   Patient belongings removed and labeled at nurses station Yes   Excess linen is removed from room Yes   All plastic bags are removed from the room and replaced with paper trash bags Yes   Patient is in gown and using hospital socks with rubber soles Yes   No metal, hard eating utensils or hard plates are on meal tray Yes   Remove all cleaning agents used by [employer] Yes   Ensure bathroom door key is easily accessible Yes   If Crucifix is hanging on a nail, remove Crucifix as well as the nail Yes       *If any question above is answered "No," documentation is required.

## 2018-07-18 NOTE — ED Notes (Signed)
 Patient Safety Attendant Yes - Name: Margie   Patient Safety Attendant Oriented YES   High risk patients are in line of sight at all times Yes   Excess equipment/medical supplies not necessary for the care of the patient removed Yes   All sharp or dangerous objects are removed from room: including but not limited to belts, pens & pencils, needles, medications, cosmetics, lighters, matches, nail files, watches, necklaces, glass objects, razors, razor blades, knives, aerosol sprays, drawstring pants, shoes, cords (telephone, call bells, etc.) cleaning wipes or other cleaning items, aluminum cans, not permanently attached wall dcor Yes   Telephone/cell phone removed as well as TV remote (batteries can be swallowed) Yes   Patient belongings removed and labeled at nurses station Yes   Excess linen is removed from room Yes   All plastic bags are removed from the room and replaced with paper trash bags Yes   Patient is in gown and using hospital socks with rubber soles Yes   No metal, hard eating utensils or hard plates are on meal tray Yes   Remove all cleaning agents used by [employer] Yes   Ensure bathroom door key is easily accessible Yes   If Crucifix is hanging on a nail, remove Crucifix as well as the nail Yes       *If any question above is answered "No," documentation is required.

## 2018-07-18 NOTE — ED Notes (Signed)
Patient report from [facility]. Care assumed at this time.

## 2018-07-18 NOTE — ED Notes (Signed)
Resting in bed with eyes closed. Lights off. Sitter at door.

## 2018-07-18 NOTE — Progress Notes (Signed)
Contacted transfer line to initiate referrals for placement.

## 2018-07-18 NOTE — ED Notes (Signed)
Patient Safety Attendant Yes - Name: Joshua Solomon   Patient Safety Attendant Oriented YES   High risk patients are in line of sight at all times Yes   Excess equipment/medical supplies not necessary for the care of the patient removed Yes   All sharp or dangerous objects are removed from room: including but not limited to belts, pens & pencils, needles, medications, cosmetics, lighters, matches, nail files, watches, necklaces, glass objects, razors, razor blades, knives, aerosol sprays, drawstring pants, shoes, cords (telephone, call bells, etc.) cleaning wipes or other cleaning items, aluminum cans, not permanently attached wall dcor Yes   Telephone/cell phone removed as well as TV remote (batteries can be swallowed) Yes   Patient belongings removed and labeled at nurses station Yes   Excess linen is removed from room Yes   All plastic bags are removed from the room and replaced with paper trash bags Yes   Patient is in gown and using hospital socks with rubber soles Yes   No metal, hard eating utensils or hard plates are on meal tray Yes   Remove all cleaning agents used by [employer] Yes   Ensure bathroom door key is easily accessible Yes   If Crucifix is hanging on a nail, remove Crucifix as well as the nail Yes       *If any question above is answered "No," documentation is required.

## 2018-07-18 NOTE — ED Notes (Signed)
Patient Safety Attendant Yes - Name: Joshua Solomon   Patient Safety Attendant Oriented YES   High risk patients are in line of sight at all times Yes   Excess equipment/medical supplies not necessary for the care of the patient removed Yes   All sharp or dangerous objects are removed from room: including but not limited to belts, pens & pencils, needles, medications, cosmetics, lighters, matches, nail files, watches, necklaces, glass objects, razors, razor blades, knives, aerosol sprays, drawstring pants, shoes, cords (telephone, call bells, etc.) cleaning wipes or other cleaning items, aluminum cans, not permanently attached wall d??cor Yes   Telephone/cell phone removed as well as TV remote (batteries can be swallowed) Yes   Patient belongings removed and labeled at nurses station Yes   Excess linen is removed from room Yes   All plastic bags are removed from the room and replaced with paper trash bags Yes   Patient is in gown and using hospital socks with rubber soles Yes   No metal, hard eating utensils or hard plates are on meal tray Yes   Remove all cleaning agents used by [employer] Yes   Ensure bathroom door key is easily accessible Yes   If Crucifix is hanging on a nail, remove Crucifix as well as the nail Yes       *If any question above is answered "No," documentation is required.

## 2018-07-18 NOTE — ED Notes (Signed)
Care assumed

## 2018-07-18 NOTE — ED Notes (Signed)
Patient report from Bailey RN. Care assumed at this time.

## 2018-07-18 NOTE — Progress Notes (Signed)
Contacted transfer line to initiate referrals for placement.

## 2018-07-19 MED FILL — SERTRALINE 25 MG TAB: 25 mg | ORAL | Qty: 2

## 2018-07-19 MED FILL — RISPERIDONE 1 MG TAB: 1 mg | ORAL | Qty: 1

## 2018-07-19 NOTE — ED Notes (Signed)
 Patient Safety Attendant Yes - Name: Joshua Solomon   Patient Safety Attendant Oriented YES   High risk patients are in line of sight at all times Yes   Excess equipment/medical supplies not necessary for the care of the patient removed Yes   All sharp or dangerous objects are removed from room: including but not limited to belts, pens & pencils, needles, medications, cosmetics, lighters, matches, nail files, watches, necklaces, glass objects, razors, razor blades, knives, aerosol sprays, drawstring pants, shoes, cords (telephone, call bells, etc.) cleaning wipes or other cleaning items, aluminum cans, not permanently attached wall dcor Yes   Telephone/cell phone removed as well as TV remote (batteries can be swallowed) Yes   Patient belongings removed and labeled at nurses station Yes   Excess linen is removed from room Yes   All plastic bags are removed from the room and replaced with paper trash bags Yes   Patient is in gown and using hospital socks with rubber soles Yes   No metal, hard eating utensils or hard plates are on meal tray Yes   Remove all cleaning agents used by [employer] Yes   Ensure bathroom door key is easily accessible Yes   If Crucifix is hanging on a nail, remove Crucifix as well as the nail Yes       *If any question above is answered "No," documentation is required.

## 2018-07-19 NOTE — ED Notes (Signed)
 Patient requesting medication to help sleep.  Patient informed of risperdal due at 2200.  Patient denies having any other needs, appears in no acute distress.  Sitter at bedside.  Will continue to monitor.

## 2018-07-19 NOTE — ED Notes (Signed)
Patient resting in bed. Patient's respirations even. No distress. Sitter at doorway.

## 2018-07-19 NOTE — ED Notes (Signed)
Report given to Riley, RN assumed care at this time

## 2018-07-19 NOTE — ED Notes (Signed)
Patient requesting update on when he can shower.  Have contacted security to see if possible for patient to take shower.

## 2018-07-19 NOTE — ED Notes (Signed)
Patient resting comfortably. Sitter at doorway.

## 2018-07-19 NOTE — ED Notes (Signed)
Patient is resting in bed on back with blankets. Eyes closed and respirations even and unlabored. Sitter at doorway.

## 2018-07-19 NOTE — ED Notes (Signed)
Patient report to [place], Charity fundraiser.  Patient care to be assumed at this time.

## 2018-07-19 NOTE — ED Notes (Signed)
Patient resting comfortably in bed at this time. No distress noted. Sitter at doorway.

## 2018-07-19 NOTE — ED Notes (Signed)
Patient report received from [place], [place]. Care assumed at this time.

## 2018-07-19 NOTE — ED Notes (Signed)
Patient resting in room with eyes closed. No signs of distress. Respirations even and unlabored. Sitter at doorway.

## 2018-07-19 NOTE — ED Notes (Signed)
Patient in bed with eyes closed. No signs of distress. Respirations even and unlabored. Sitter at doorway.

## 2018-07-19 NOTE — Progress Notes (Signed)
Met with patient in the ER.  Updated patient on status.  Patient states he came here approximately 1 month ago to try a substance abuse program.  Patient names [facility] (states "it didn't work out) and [facility] (cites unable to to pay money requested as a reason for not going into this program).  Patient states he is not suicidal today.  Patient aware that reevaluation would be tomorrow.  Patient states he would like to call [facility] Antionette Fairy?) if MD takes him off IVC.

## 2018-07-19 NOTE — ED Notes (Signed)
Patient Safety Attendant Yes - Name: Gavin Pound   Patient Safety Attendant Oriented YES   High risk patients are in line of sight at all times Yes   Excess equipment/medical supplies not necessary for the care of the patient removed Yes   All sharp or dangerous objects are removed from room: including but not limited to belts, pens & pencils, needles, medications, cosmetics, lighters, matches, nail files, watches, necklaces, glass objects, razors, razor blades, knives, aerosol sprays, drawstring pants, shoes, cords (telephone, call bells, etc.) cleaning wipes or other cleaning items, aluminum cans, not permanently attached wall dcor Yes   Telephone/cell phone removed as well as TV remote (batteries can be swallowed) Yes   Patient belongings removed and labeled at nurses station Yes   Excess linen is removed from room Yes   All plastic bags are removed from the room and replaced with paper trash bags Yes   Patient is in gown and using hospital socks with rubber soles Yes   No metal, hard eating utensils or hard plates are on meal tray Yes   Remove all cleaning agents used by [employer] Yes   Ensure bathroom door key is easily accessible Yes   If Crucifix is hanging on a nail, remove Crucifix as well as the nail Yes       *If any question above is answered "No," documentation is required.

## 2018-07-19 NOTE — ED Notes (Signed)
Patient resting comfortably with eyes closed. No signs of distress. Respirations even and unlabored. Sitter at doorway.

## 2018-07-19 NOTE — ED Notes (Signed)
Patient resting with eyes closed. Respirations even and unlabored. Sitter at doorway.

## 2018-07-19 NOTE — ED Notes (Signed)
Patient report received from Riley, RN.  Patient care to be assumed at this time.

## 2018-07-19 NOTE — ED Notes (Signed)
Patient awake and resting in bed. Respirations even and unlabored. No signs of distress and no requests at this time. Sitter at doorway.

## 2018-07-19 NOTE — ED Notes (Signed)
Patient resting in stretcher, appears in no acute distress.  Sitter at bedside.  Will continue to monitor.

## 2018-07-19 NOTE — ED Notes (Signed)
Police officer present at this time.

## 2018-07-19 NOTE — ED Notes (Signed)
Patient yelling, aggressive due to not having shower yet.  Dr. Eldridge Abrahams has spoken with patient and patient cussing, verbally aggressive.  Police have been called.

## 2018-07-19 NOTE — ED Notes (Signed)
Patient resting in bed with blankets, eyes closed. No signs of distress. Respirations even and unlabored. Sitter at doorway.

## 2018-07-19 NOTE — ED Notes (Signed)
Patient awake and lying on left side. No distress noted. Sitter at doorway.

## 2018-07-19 NOTE — ED Notes (Signed)
Patient report given to Erika, RN. Care transferred at this time.

## 2018-07-19 NOTE — ED Notes (Signed)
Patient resting in bed. Eyes closed. No distress noted. Respirations even and unlabored. Sitter at doorway.

## 2018-07-19 NOTE — ED Notes (Signed)
Police officer present at this time.

## 2018-07-19 NOTE — Progress Notes (Signed)
Met with patient in the ER.  Updated patient on status.  Patient states he came here approximately 1 month ago to try a substance abuse program.  Patient names [facility] (states "it didn't work out) and [facility] (cites unable to to pay money requested as a reason for not going into this program).  Patient states he is not suicidal today.  Patient aware that reevaluation would be tomorrow.  Patient states he would like to call [facility] ([place]?) if MD takes him off IVC.

## 2018-07-19 NOTE — ED Notes (Signed)
Patient Safety Attendant Yes - Name: Margie   Patient Safety Attendant Oriented YES   High risk patients are in line of sight at all times Yes   Excess equipment/medical supplies not necessary for the care of the patient removed Yes   All sharp or dangerous objects are removed from room: including but not limited to belts, pens & pencils, needles, medications, cosmetics, lighters, matches, nail files, watches, necklaces, glass objects, razors, razor blades, knives, aerosol sprays, drawstring pants, shoes, cords (telephone, call bells, etc.) cleaning wipes or other cleaning items, aluminum cans, not permanently attached wall d??cor Yes   Telephone/cell phone removed as well as TV remote (batteries can be swallowed) Yes   Patient belongings removed and labeled at nurses station Yes   Excess linen is removed from room Yes   All plastic bags are removed from the room and replaced with paper trash bags Yes   Patient is in gown and using hospital socks with rubber soles Yes   No metal, hard eating utensils or hard plates are on meal tray Yes   Remove all cleaning agents used by [employer] Yes   Ensure bathroom door key is easily accessible Yes   If Crucifix is hanging on a nail, remove Crucifix as well as the nail Yes       *If any question above is answered "No," documentation is required.

## 2018-07-19 NOTE — ED Notes (Signed)
Patient report to Caitlin, RN.  Patient care to be assumed at this time.

## 2018-07-19 NOTE — ED Notes (Signed)
Patient resting in bed. Patient's respirations even. No distress. Sitter at doorway.

## 2018-07-19 NOTE — ED Notes (Signed)
Patient resting in bed. Eyes closed. No distress noted. Respirations even and unlabored. Sitter at doorway.

## 2018-07-19 NOTE — ED Notes (Signed)
Patient resting comfortably in bed at this time. No distress noted. Sitter at doorway.

## 2018-07-19 NOTE — ED Notes (Signed)
Patient resting comfortably with eyes closed. No signs of distress. Respirations even and unlabored. Sitter at doorway.

## 2018-07-19 NOTE — ED Notes (Signed)
Patient in bed with eyes closed. No signs of distress. Respirations even and unlabored. Sitter at doorway.

## 2018-07-19 NOTE — ED Notes (Signed)
Patient awake and lying on left side. No distress noted. Sitter at doorway.

## 2018-07-19 NOTE — ED Notes (Signed)
Patient report received from Shekia, RN. Care assumed at this time.

## 2018-07-19 NOTE — ED Notes (Signed)
Patient requesting update on when he can shower.  Have contacted security to see if possible for patient to take shower.

## 2018-07-19 NOTE — ED Notes (Signed)
Patient is resting in bed on back with blankets. Eyes closed and respirations even and unlabored. Sitter at doorway.

## 2018-07-19 NOTE — ED Notes (Signed)
Patient yelling, aggressive due to not having shower yet.  Dr. Mewborn has spoken with patient and patient cussing, verbally aggressive.  Police have been called.

## 2018-07-19 NOTE — ED Notes (Signed)
Patient awake and resting in bed. Respirations even and unlabored. No signs of distress and no requests at this time. Sitter at doorway.

## 2018-07-19 NOTE — ED Notes (Signed)
Patient resting comfortably. Sitter at doorway.

## 2018-07-19 NOTE — ED Notes (Signed)
Patient resting in bed with blankets, eyes closed. No signs of distress. Respirations even and unlabored. Sitter at doorway.

## 2018-07-19 NOTE — ED Notes (Signed)
Patient requesting "medication to help sleep."  Patient informed of risperdal due at 2200.  Patient denies having any other needs, appears in no acute distress.  Sitter at bedside.  Will continue to monitor.

## 2018-07-19 NOTE — ED Notes (Signed)
Patient resting in room with eyes closed. No signs of distress. Respirations even and unlabored. Sitter at doorway.

## 2018-07-19 NOTE — ED Notes (Signed)
Patient Safety Attendant Yes - Name: Deborah   Patient Safety Attendant Oriented YES   High risk patients are in line of sight at all times Yes   Excess equipment/medical supplies not necessary for the care of the patient removed Yes   All sharp or dangerous objects are removed from room: including but not limited to belts, pens & pencils, needles, medications, cosmetics, lighters, matches, nail files, watches, necklaces, glass objects, razors, razor blades, knives, aerosol sprays, drawstring pants, shoes, cords (telephone, call bells, etc.) cleaning wipes or other cleaning items, aluminum cans, not permanently attached wall d??cor Yes   Telephone/cell phone removed as well as TV remote (batteries can be swallowed) Yes   Patient belongings removed and labeled at nurses station Yes   Excess linen is removed from room Yes   All plastic bags are removed from the room and replaced with paper trash bags Yes   Patient is in gown and using hospital socks with rubber soles Yes   No metal, hard eating utensils or hard plates are on meal tray Yes   Remove all cleaning agents used by [employer] Yes   Ensure bathroom door key is easily accessible Yes   If Crucifix is hanging on a nail, remove Crucifix as well as the nail Yes       *If any question above is answered "No," documentation is required.

## 2018-07-20 MED ORDER — CLONIDINE 0.1 MG TAB
0.1 mg | ORAL_TABLET | Freq: Two times a day (BID) | ORAL | 0 refills | Status: AC
Start: 2018-07-20 — End: 2018-07-27
  Filled 2018-07-20: qty 14, 7d supply, fill #0

## 2018-07-20 MED ORDER — TRAZODONE 50 MG TAB
50 mg | ORAL_TABLET | Freq: Every evening | ORAL | 0 refills | Status: AC
Start: 2018-07-20 — End: ?
  Filled 2018-07-20: qty 14, 14d supply, fill #0

## 2018-07-20 MED FILL — RISPERIDONE 1 MG TAB: 1 mg | ORAL | Qty: 1

## 2018-07-20 MED FILL — SERTRALINE 25 MG TAB: 25 mg | ORAL | Qty: 2

## 2018-07-20 NOTE — ED Notes (Signed)
I have reviewed discharge instructions with the patient.  The patient verbalized understanding.    Patient left ED via Discharge Method: ambulatory to Home with self.      Opportunity for questions and clarification provided.       Patient given 2 scripts.  No e-sign.          To continue your aftercare when you leave the hospital, you may receive an automated call from our care team to check in on how you are doing.  This is a free service and part of our promise to provide the best care and service to meet your aftercare needs." If you have questions, or wish to unsubscribe from this service please call [phone].  Thank you for Choosing our [facility] Emergency Department.

## 2018-07-20 NOTE — ED Notes (Signed)
Patient Safety Attendant Yes - Name: [facility]   Patient Safety Attendant Oriented YES   High risk patients are in line of sight at all times Yes   Excess equipment/medical supplies not necessary for the care of the patient removed Yes   All sharp or dangerous objects are removed from room: including but not limited to belts, pens & pencils, needles, medications, cosmetics, lighters, matches, nail files, watches, necklaces, glass objects, razors, razor blades, knives, aerosol sprays, drawstring pants, shoes, cords (telephone, call bells, etc.) cleaning wipes or other cleaning items, aluminum cans, not permanently attached wall dcor Yes   Telephone/cell phone removed as well as TV remote (batteries can be swallowed) Yes   Patient belongings removed and labeled at nurses station Yes   Excess linen is removed from room Yes   All plastic bags are removed from the room and replaced with paper trash bags Yes   Patient is in gown and using hospital socks with rubber soles Yes   No metal, hard eating utensils or hard plates are on meal tray Yes   Remove all cleaning agents used by [employer] Yes   Ensure bathroom door key is easily accessible Yes   If Crucifix is hanging on a nail, remove Crucifix as well as the nail Yes       *If any question above is answered "No," documentation is required.

## 2018-07-20 NOTE — Progress Notes (Signed)
Pt to dc today. MD requested med vouchers for:    Trazadone 50mg  #14 tabs  Catapres 0,1mg  #14 tabs    Medications voucher and RX faxed to [facility] pt to go by pick up medications once dc from ED.

## 2018-07-20 NOTE — ED Notes (Signed)
[facility] ED Psychiatric RECHECK NOTE for 07/20/2018  Arrival Date/Time: 07/17/2018 10:21 AM      Narinder Dotts  MRN: [id number]    Date of Birth: [birth year]   48 y.o. male    [facility] EMERGENCY DEPT ER15/15  Seen on 07/20/2018 @ 9:49 AM       ??? he is on papers. Commitment Papers expire on: 07/20/2018 @1100    ??? he has completed a tele-psych evaluation. 07/20/2018, 9:49 AM     Ellington Bozzi is a 48 y.o. male here for psychiatric care  Objective:   Vitals:    07/17/18 2223 07/18/18 0811 07/19/18 1742 07/20/18 0849   BP: (!) 147/93 123/85 134/88 141/88   Pulse: 65 71 (!) 57 (!) 56   Resp: 18 16 18 16    Temp:  97.5 ??F (36.4 ??C) 98.9 ??F (37.2 ??C) 98 ??F (36.7 ??C)   SpO2: 97% 100% 97% 97%       Recent Labs:   Recent Labs     07/17/18  1034   NA 143   K 3.9   CL 107   CO2 28   BUN 11   CREA 1.10   CA 8.7   GLU 102*      No lab exists for component: UDS,  MBA    Current Facility-Administered Medications   Medication Dose Route Frequency   ??? sertraline (ZOLOFT) tablet 50 mg  50 mg Oral DAILY   ??? risperiDONE (RisperDAL) tablet 1 mg  1 mg Oral QHS     Current Outpatient Medications   Medication Sig   ??? risperiDONE (RisperDAL) 0.5 mg tablet Take one each morning, and two each evening   ??? traZODone (DESYREL) 100 mg tablet Take 1 Tab by mouth nightly.       Assessment and Plan:  D/w telepsych  --okay for discharge,   -- recommends trazadone 50mg  qhs    Matt Holmes, MD; 07/20/2018 @9 :49 AM ===============

## 2018-07-20 NOTE — ED Notes (Signed)
Report given to Melissa C., RN for continuation of care

## 2018-07-20 NOTE — ED Notes (Signed)
 Patient Safety Attendant Yes - Name: Margie   Patient Safety Attendant Oriented YES   High risk patients are in line of sight at all times Yes   Excess equipment/medical supplies not necessary for the care of the patient removed Yes   All sharp or dangerous objects are removed from room: including but not limited to belts, pens & pencils, needles, medications, cosmetics, lighters, matches, nail files, watches, necklaces, glass objects, razors, razor blades, knives, aerosol sprays, drawstring pants, shoes, cords (telephone, call bells, etc.) cleaning wipes or other cleaning items, aluminum cans, not permanently attached wall dcor Yes   Telephone/cell phone removed as well as TV remote (batteries can be swallowed) Yes   Patient belongings removed and labeled at nurses station Yes   Excess linen is removed from room Yes   All plastic bags are removed from the room and replaced with paper trash bags Yes   Patient is in gown and using hospital socks with rubber soles Yes   No metal, hard eating utensils or hard plates are on meal tray Yes   Remove all cleaning agents used by [employer] Yes   Ensure bathroom door key is easily accessible Yes   If Crucifix is hanging on a nail, remove Crucifix as well as the nail Yes       *If any question above is answered "No," documentation is required.

## 2018-07-20 NOTE — Progress Notes (Signed)
Patient requested info on [facility] [place] admissions/Intake. SW-CM inquired for patient and assisted with application that patient asked assistance with. Patient states that he has spoken with:  Mariane Masters, Manager  [phone] who is agreeable to pick him up today. Patient also received medication voucher for discharge meds today.

## 2018-07-20 NOTE — ED Notes (Signed)
This RN spoke to Dr Judi Saa on the phone to give report so he can meet with patient over telepsych.

## 2018-07-20 NOTE — Progress Notes (Signed)
Pt to dc today. MD requested med vouchers for:    Trazadone 50mg #14 tabs  Catapres 0,1mg #14 tabs    Medications voucher and RX faxed to [facility] pt to go by pick up medications once dc from ED.

## 2018-07-20 NOTE — ED Notes (Signed)
Patient Safety Attendant Yes - Name: Joshua Solomon   Patient Safety Attendant Oriented YES   High risk patients are in line of sight at all times Yes   Excess equipment/medical supplies not necessary for the care of the patient removed Yes   All sharp or dangerous objects are removed from room: including but not limited to belts, pens & pencils, needles, medications, cosmetics, lighters, matches, nail files, watches, necklaces, glass objects, razors, razor blades, knives, aerosol sprays, drawstring pants, shoes, cords (telephone, call bells, etc.) cleaning wipes or other cleaning items, aluminum cans, not permanently attached wall d??cor Yes   Telephone/cell phone removed as well as TV remote (batteries can be swallowed) Yes   Patient belongings removed and labeled at nurses station Yes   Excess linen is removed from room Yes   All plastic bags are removed from the room and replaced with paper trash bags Yes   Patient is in gown and using hospital socks with rubber soles Yes   No metal, hard eating utensils or hard plates are on meal tray Yes   Remove all cleaning agents used by [employer] Yes   Ensure bathroom door key is easily accessible Yes   If Crucifix is hanging on a nail, remove Crucifix as well as the nail Yes       *If any question above is answered "No," documentation is required.

## 2018-07-20 NOTE — ED Notes (Signed)
[facility] ED Psychiatric RECHECK NOTE for 07/20/2018  Arrival Date/Time: 07/17/2018 10:21 AM      Joshua Solomon  MRN: [id number]    Date of Birth: 01-Sep-1970   48 y.o. male    [facility] EMERGENCY DEPT ER15/15  Seen on 07/20/2018 @ 9:49 AM       ? he is on papers. Commitment Papers expire on: 07/20/2018 @1100    ? he has completed a tele-psych evaluation. 07/20/2018, 9:49 AM     Joshua Solomon is a 48 y.o. male here for psychiatric care  Objective:   Vitals:    07/17/18 2223 07/18/18 0811 07/19/18 1742 07/20/18 0849   BP: (!) 147/93 123/85 134/88 141/88   Pulse: 65 71 (!) 57 (!) 56   Resp: 18 16 18 16    Temp:  97.5 ??F (36.4 ??C) 98.9 ??F (37.2 ??C) 98 ??F (36.7 ??C)   SpO2: 97% 100% 97% 97%       Recent Labs:   Recent Labs     07/17/18  1034   NA 143   K 3.9   CL 107   CO2 28   BUN 11   CREA 1.10   CA 8.7   GLU 102*      No lab exists for component: UDS,  MBA    Current Facility-Administered Medications   Medication Dose Route Frequency   ??? sertraline (ZOLOFT) tablet 50 mg  50 mg Oral DAILY   ??? risperiDONE (RisperDAL) tablet 1 mg  1 mg Oral QHS     Current Outpatient Medications   Medication Sig   ??? risperiDONE (RisperDAL) 0.5 mg tablet Take one each morning, and two each evening   ??? traZODone (DESYREL) 100 mg tablet Take 1 Tab by mouth nightly.       Assessment and Plan:  D/w telepsych  --okay for discharge,   -- recommends trazadone 50mg  qhs    Matt Holmes, MD; 07/20/2018 @9 :49 AM ===============

## 2018-07-20 NOTE — ED Notes (Signed)
I have reviewed discharge instructions with the patient.  The patient verbalized understanding.    Patient left ED via Discharge Method: ambulatory to Home with (self).    Opportunity for questions and clarification provided.       Patient given 2 scripts.     No e-sign.    To continue your aftercare when you leave the hospital, you may receive an automated call from our care team to check in on how you are doing.  This is a free service and part of our promise to provide the best care and service to meet your aftercare needs.??? If you have questions, or wish to unsubscribe from this service please call [phone].  Thank you for Choosing our [facility] Emergency Department.

## 2018-07-20 NOTE — ED Notes (Signed)
This RN spoke to Dr Hulkoer on the phone to give report so he can meet with patient over telepsych.

## 2018-07-20 NOTE — ED Notes (Signed)
Patient Safety Attendant Yes - Name: Shelby   Patient Safety Attendant Oriented YES   High risk patients are in line of sight at all times Yes   Excess equipment/medical supplies not necessary for the care of the patient removed Yes   All sharp or dangerous objects are removed from room: including but not limited to belts, pens & pencils, needles, medications, cosmetics, lighters, matches, nail files, watches, necklaces, glass objects, razors, razor blades, knives, aerosol sprays, drawstring pants, shoes, cords (telephone, call bells, etc.) cleaning wipes or other cleaning items, aluminum cans, not permanently attached wall d??cor Yes   Telephone/cell phone removed as well as TV remote (batteries can be swallowed) Yes   Patient belongings removed and labeled at nurses station Yes   Excess linen is removed from room Yes   All plastic bags are removed from the room and replaced with paper trash bags Yes   Patient is in gown and using hospital socks with rubber soles Yes   No metal, hard eating utensils or hard plates are on meal tray Yes   Remove all cleaning agents used by [employer] Yes   Ensure bathroom door key is easily accessible Yes   If Crucifix is hanging on a nail, remove Crucifix as well as the nail Yes       *If any question above is answered "No," documentation is required.

## 2018-07-20 NOTE — Progress Notes (Signed)
Patient requested info on [facility] in [place] admissions/Intake. SW-CM inquired for patient and assisted with application that patient asked assistance with. Patient states that he has spoken with:  Mitch Trudell, Manager  [phone] who is agreeable to pick him up today. Patient also received medication voucher for discharge meds today.

## 2018-07-24 ENCOUNTER — Inpatient Hospital Stay
Admit: 2018-07-24 | Discharge: 2018-07-24 | Disposition: A | Discharge status: Home / Self Care | Payer: Self-pay | Attending: Emergency Medicine

## 2018-07-24 DIAGNOSIS — Z590 Homelessness: Secondary | ICD-10-CM

## 2018-07-24 LAB — CBC WITH AUTO DIFFERENTIAL
Basophils %: 0 % (ref 0.0–2.0)
Basophils Absolute: 0 10*3/uL (ref 0.0–0.2)
Eosinophils %: 0 % — ABNORMAL LOW (ref 0.5–7.8)
Eosinophils Absolute: 0 10*3/uL (ref 0.0–0.8)
Granulocyte Absolute Count: 0 10*3/uL (ref 0.0–0.5)
Hematocrit: 44.1 % (ref 41.1–50.3)
Hemoglobin: 15.2 g/dL (ref 13.6–17.2)
Immature Granulocytes: 0 % (ref 0.0–5.0)
Lymphocytes %: 11 % — ABNORMAL LOW (ref 13–44)
Lymphocytes Absolute: 1.2 10*3/uL (ref 0.5–4.6)
MCH: 31.5 PG (ref 26.1–32.9)
MCHC: 34.5 g/dL (ref 31.4–35.0)
MCV: 91.3 FL (ref 79.6–97.8)
MPV: 11.4 FL (ref 9.4–12.3)
Monocytes %: 8 % (ref 4.0–12.0)
Monocytes Absolute: 0.8 10*3/uL (ref 0.1–1.3)
NRBC Absolute: 0 10*3/uL (ref 0.0–0.2)
Neutrophils %: 81 % — ABNORMAL HIGH (ref 43–78)
Neutrophils Absolute: 8.8 10*3/uL — ABNORMAL HIGH (ref 1.7–8.2)
Platelets: 245 10*3/uL (ref 150–450)
RBC: 4.83 M/uL (ref 4.23–5.6)
RDW: 13.1 % (ref 11.9–14.6)
WBC: 10.9 10*3/uL (ref 4.3–11.1)

## 2018-07-24 LAB — DRUG SCREEN, URINE
AMPHETAMINES: POSITIVE
Amphetamine Screen, Urine: POSITIVE
BARBITURATES: NEGATIVE
BENZODIAZEPINES: NEGATIVE
Barbiturate Screen, Urine: NEGATIVE
Benzodiazepine Screen, Urine: NEGATIVE
COCAINE: POSITIVE
Cocaine Screen Urine: POSITIVE
METHADONE: NEGATIVE
Methadone Screen, Urine: NEGATIVE
OPIATES: NEGATIVE
Opiate Screen, Urine: NEGATIVE
PCP Screen, Urine: NEGATIVE
PCP(PHENCYCLIDINE): NEGATIVE
THC (TH-CANNABINOL): NEGATIVE
THC Screen, Urine: NEGATIVE

## 2018-07-24 LAB — COMPREHENSIVE METABOLIC PANEL
ALT: 33 U/L (ref 12–65)
AST: 20 U/L (ref 15–37)
Albumin/Globulin Ratio: 0.8 — ABNORMAL LOW (ref 1.2–3.5)
Albumin: 3.9 g/dL (ref 3.5–5.0)
Alkaline Phosphatase: 116 U/L (ref 50–136)
Anion Gap: 8 mmol/L (ref 7–16)
BUN: 14 MG/DL (ref 6–23)
CO2: 26 mmol/L (ref 21–32)
Calcium: 9.1 MG/DL (ref 8.3–10.4)
Chloride: 104 mmol/L (ref 98–107)
Creatinine: 1.23 MG/DL (ref 0.8–1.5)
EGFR IF NonAfrican American: >60 mL/min/{1.73_m2} (ref >60)
GFR African American: >60 mL/min/{1.73_m2} (ref >60)
Globulin: 4.7 g/dL — ABNORMAL HIGH (ref 2.3–3.5)
Glucose: 114 mg/dL — ABNORMAL HIGH (ref 65–100)
Potassium: 3.4 mmol/L — ABNORMAL LOW (ref 3.5–5.1)
Sodium: 138 mmol/L (ref 136–145)
Total Bilirubin: 0.2 MG/DL (ref 0.2–1.1)
Total Protein: 8.6 g/dL — ABNORMAL HIGH (ref 6.3–8.2)

## 2018-07-24 LAB — ETHYL ALCOHOL
ALCOHOL(ETHYL),SERUM: <3 MG/DL
Ethyl Alcohol: <3 MG/DL

## 2018-07-24 LAB — SALICYLATE
Salicylate level: 3.5 MG/DL (ref 2.8–20.0)
Salicylate: 3.5 MG/DL (ref 2.8–20.0)

## 2018-07-24 LAB — ACETAMINOPHEN LEVEL: Acetaminophen Level: <30 ug/mL (ref 10.0–30.0)

## 2018-07-24 LAB — CBC WITH AUTOMATED DIFF
ABS. BASOPHILS: 0 10*3/uL (ref 0.0–0.2)
ABS. EOSINOPHILS: 0 10*3/uL (ref 0.0–0.8)
ABS. IMM. GRANS.: 0 10*3/uL (ref 0.0–0.5)
ABS. LYMPHOCYTES: 1.2 10*3/uL (ref 0.5–4.6)
ABS. MONOCYTES: 0.8 10*3/uL (ref 0.1–1.3)
ABS. NEUTROPHILS: 8.8 10*3/uL — ABNORMAL HIGH (ref 1.7–8.2)
ABSOLUTE NRBC: 0 10*3/uL (ref 0.0–0.2)
BASOPHILS: 0 % (ref 0.0–2.0)
EOSINOPHILS: 0 % — ABNORMAL LOW (ref 0.5–7.8)
HCT: 44.1 % (ref 41.1–50.3)
HGB: 15.2 g/dL (ref 13.6–17.2)
IMMATURE GRANULOCYTES: 0 % (ref 0.0–5.0)
LYMPHOCYTES: 11 % — ABNORMAL LOW (ref 13–44)
MCH: 31.5 PG (ref 26.1–32.9)
MCHC: 34.5 g/dL (ref 31.4–35.0)
MCV: 91.3 FL (ref 79.6–97.8)
MONOCYTES: 8 % (ref 4.0–12.0)
MPV: 11.4 FL (ref 9.4–12.3)
NEUTROPHILS: 81 % — ABNORMAL HIGH (ref 43–78)
PLATELET: 245 10*3/uL (ref 150–450)
RBC: 4.83 M/uL (ref 4.23–5.6)
RDW: 13.1 % (ref 11.9–14.6)
WBC: 10.9 10*3/uL (ref 4.3–11.1)

## 2018-07-24 LAB — METABOLIC PANEL, COMPREHENSIVE
A-G Ratio: 0.8 — ABNORMAL LOW (ref 1.2–3.5)
ALT (SGPT): 33 U/L (ref 12–65)
AST (SGOT): 20 U/L (ref 15–37)
Albumin: 3.9 g/dL (ref 3.5–5.0)
Alk. phosphatase: 116 U/L (ref 50–136)
Anion gap: 8 mmol/L (ref 7–16)
BUN: 14 MG/DL (ref 6–23)
Bilirubin, total: 0.2 MG/DL (ref 0.2–1.1)
CO2: 26 mmol/L (ref 21–32)
Calcium: 9.1 MG/DL (ref 8.3–10.4)
Chloride: 104 mmol/L (ref 98–107)
Creatinine: 1.23 MG/DL (ref 0.8–1.5)
GFR est AA: >60 mL/min/{1.73_m2} (ref >60)
GFR est non-AA: >60 mL/min/{1.73_m2} (ref >60)
Globulin: 4.7 g/dL — ABNORMAL HIGH (ref 2.3–3.5)
Glucose: 114 mg/dL — ABNORMAL HIGH (ref 65–100)
Potassium: 3.4 mmol/L — ABNORMAL LOW (ref 3.5–5.1)
Protein, total: 8.6 g/dL — ABNORMAL HIGH (ref 6.3–8.2)
Sodium: 138 mmol/L (ref 136–145)

## 2018-07-24 LAB — ACETAMINOPHEN: Acetaminophen level: <30 ug/mL (ref 10.0–30.0)

## 2018-07-24 MED ORDER — WATER FOR INJECTION, STERILE INJECTION
20 mg/mL (final conc.) | INTRAMUSCULAR | Status: DC
Start: 2018-07-24 — End: 2018-07-24

## 2018-07-24 MED FILL — ZIPRASIDONE MESYLATE 20 MG IM SOLR: 20 mg/mL (final conc.) | INTRAMUSCULAR | Qty: 20

## 2018-07-24 NOTE — ED Notes (Signed)
Pt agitated and requesting to speak with nurse. Pt wanted to be transferred to psych facility. Informed patient that he has be accepted to a facility and it takes time.  Educated patient on 72 hour psych hold. Pt speaking loudly to staff, argumentative and very agitated.

## 2018-07-24 NOTE — ED Notes (Signed)
Pt walked out of facility doors. Security to follow in an attempt to speak with pt.

## 2018-07-24 NOTE — ED Notes (Signed)
Pt arrives via ems with mask in place. Pt reports he is feeling suicidal for a few weeks now. States he does not have a plan for how to hurt himself. Denies HI. Reports auditory hallucinations. Hx depression, anxiety, schizophrena. States he takes zoloft and trazadone. Reports etoh, 18 pack today. Reports using meth.

## 2018-07-24 NOTE — ED Notes (Signed)
Pt states "there's people trying to kill me".

## 2018-07-24 NOTE — ED Notes (Signed)
Report to Amanda, RN to assume care at this time.

## 2018-07-24 NOTE — ED Notes (Signed)
Pt requesting to speak with the doctor. Pt requesting to be transferred to [facility]. Informed patient that we are not able to do that. Pt cursing at staff and being uncooperative. Security called and MD informed.

## 2018-07-24 NOTE — ED Provider Notes (Signed)
48-year-old male complains of depression and suicidal feelings.  Does not have a plan.  History of schizophrenia on Zoloft and trazodone been drinking alcohol and using meth today.      Mental Health Problem    This is a chronic problem. The current episode started more than 1 week ago. The problem has not changed since onset.Pertinent negatives include no confusion, no delusions, no hallucinations, no self-injury and no violence. His past medical history is significant for depression and psychotropic medication treatment. His past medical history does not include seizures, CVA or TIA.        No past medical history on file.    No past surgical history on file.      No family history on file.    Social History     Socioeconomic History   ??? Marital status: SINGLE     Spouse name: Not on file   ??? Number of children: Not on file   ??? Years of education: Not on file   ??? Highest education level: Not on file   Occupational History   ??? Not on file   Social Needs   ??? Financial resource strain: Not on file   ??? Food insecurity     Worry: Not on file     Inability: Not on file   ??? Transportation needs     Medical: Not on file     Non-medical: Not on file   Tobacco Use   ??? Smoking status: Current Every Day Smoker   ??? Smokeless tobacco: Never Used   Substance and Sexual Activity   ??? Alcohol use: Yes   ??? Drug use: Yes     Types: Cocaine   ??? Sexual activity: Not on file   Lifestyle   ??? Physical activity     Days per week: Not on file     Minutes per session: Not on file   ??? Stress: Not on file   Relationships   ??? Social Product manager on phone: Not on file     Gets together: Not on file     Attends religious service: Not on file     Active member of club or organization: Not on file     Attends meetings of clubs or organizations: Not on file     Relationship status: Not on file   ??? Intimate partner violence     Fear of current or ex partner: Not on file     Emotionally abused: Not on file     Physically abused: Not on file      Forced sexual activity: Not on file   Other Topics Concern   ??? Not on file   Social History Narrative   ??? Not on file         ALLERGIES: Patient has no known allergies.    Review of Systems   Constitutional: Negative.  Negative for activity change.   HENT: Negative.    Eyes: Negative.    Respiratory: Negative.    Cardiovascular: Negative.    Gastrointestinal: Negative.    Genitourinary: Negative.    Musculoskeletal: Negative.    Skin: Negative.    Neurological: Negative.    Psychiatric/Behavioral: Negative.  Negative for confusion, hallucinations and self-injury.   All other systems reviewed and are negative.      Vitals:    07/24/18 0432   BP: 157/89   Pulse: (!) 111   Resp: 16   Temp: 99.2 ??F (37.3 ??  C)   SpO2: 97%   Weight: 117.9 kg (260 lb)   Height: 6\' 4"  (1.93 m)            Physical Exam  Vitals signs and nursing note reviewed.   Constitutional:       General: He is not in acute distress.     Appearance: He is well-developed. He is not diaphoretic.   HENT:      Head: Normocephalic and atraumatic.      Right Ear: External ear normal.      Left Ear: External ear normal.      Nose: Nose normal.      Mouth/Throat:      Pharynx: No oropharyngeal exudate.   Eyes:      General: No scleral icterus.        Right eye: No discharge.         Left eye: No discharge.      Conjunctiva/sclera: Conjunctivae normal.      Pupils: Pupils are equal, round, and reactive to light.   Neck:      Musculoskeletal: Normal range of motion and neck supple.      Vascular: No JVD.      Trachea: No tracheal deviation.   Cardiovascular:      Rate and Rhythm: Normal rate and regular rhythm.   Pulmonary:      Effort: Pulmonary effort is normal. No respiratory distress.      Breath sounds: Normal breath sounds. No stridor. No wheezing.   Chest:      Chest wall: No tenderness.   Abdominal:      General: Bowel sounds are normal. There is no distension.      Palpations: Abdomen is soft. There is no mass.      Tenderness: There is no abdominal  tenderness.   Musculoskeletal: Normal range of motion.         General: No tenderness.   Skin:     General: Skin is warm and dry.      Coloration: Skin is not pale.      Findings: No erythema or rash.   Neurological:      Mental Status: He is alert and oriented to person, place, and time.      Cranial Nerves: No cranial nerve deficit.   Psychiatric:         Behavior: Behavior normal.         Thought Content: Thought content normal.          MDM  Number of Diagnoses or Management Options  Cocaine abuse [facility](HCC):   Homelessness:   Malingering:   Methamphetamine abuse [facility](HCC):   Diagnosis management comments: Awaiting psych evaluation.  Patient was [facility] 2 days ago at [facility] found not to be suicidal or homicidal.  He was sent to rehab facility but left the facility within 24 hours    =============================  Was asked to come to the room to speak with patient.  He apparently does not want to be here because he does not feel safe here.  We apparently do not have metal detectors here.  He wants to go to [facility] because they have TVs.  Explained to the patient that he is unable to do that because that is considered an EMTALA violation but we cannot transfer psychiatric patient.  He stated to me that "this is bullshit". Can I talk to that psych doctor again?  I do not feel like I need to be here anymore.  Will request for psych evaluation again.    Candita Borenstein Marzella Schleinhanh Earnie Rockhold, MD  4:39 PM  ===========================  Spoke with patient again.  Stated he needs his phone because he needs to talk to his people.  In my opinion in speaking with the patient I do not feel he has any true psychiatric emergency.  His stating of depression is likely for secondary gain.  He displays signs of malingering.  He wants food.  He wants a room with TV.  He wants a better bed.  I do not feel he needs to be here.  Decertification completed.  Patient discharged.    Ahnika Hannibal Marzella Schleinhanh Lanee Chain, MD  4:56  PM  ======================================       Amount and/or Complexity of Data Reviewed  Clinical lab tests: ordered and reviewed  Tests in the medicine section of CPT??: ordered and reviewed    Risk of Complications, Morbidity, and/or Mortality  Presenting problems: moderate  Diagnostic procedures: moderate  Management options: moderate           Procedures

## 2018-07-24 NOTE — ED Notes (Signed)
Pt white papers re-versed by Dr.Ta. Pt refusing last set of vitals. Given belongings back. Refused dc papers.

## 2018-07-24 NOTE — ED Notes (Signed)
Police brought patient back. Pt was found at the store drinking a beer.

## 2018-07-24 NOTE — ED Notes (Signed)
 Pt found this nurse to inquire about rules in using phone. Pt educated that rules regarding phone use for patients under mental health safety environments are mostly congruent with those at a lockdown facility. At any time the privilege for use of the phone may be removed. Pt educated on his behavior with the phone and the inappropriate nature as to why the phone is not available for his use as this time. Pt educated although his call was not 15 minutes, his behavior toward staff was aggressive and unacceptable. Pt began stating you are one nasty mother fucking bitch. You are just an awful bitch. You fucking mother fucking nasty ass bitch. Pt initially refusing to go back to his room. Pt educated that seating in the hallway, away from his room, is not acceptable at this time. Security present. Pt walked to his room and is now seated in a chair outside of his door. Sitter remains present. Security remains present in the ER.

## 2018-07-24 NOTE — ED Notes (Signed)
Pt walking on [address] toward the outpatient area. Security back to building at this time. [employer] notified.

## 2018-07-24 NOTE — ED Notes (Signed)
Pt on facility phone and pacing in hallway and in bay at registration. Small child behind pt, sitter with pt. Pt is speaking loudly and cursing. Pt is being rude with staff. Pt educated the phone call is to end at this time and pt to relinquish phone to staff. Pt refusing at this time. Security called. Pt ended call after asking for number to this facility and provided number to the party on the other line. With security present, pt walked back to his room and is sitting in a chair outside of room.

## 2018-07-24 NOTE — ED Notes (Signed)
Patient Safety Attendant Yes - Name: [facility]   Patient Safety Attendant Oriented YES   High risk patients are in line of sight at all times Yes   Excess equipment/medical supplies not necessary for the care of the patient removed Yes   All sharp or dangerous objects are removed from room: including but not limited to belts, pens & pencils, needles, medications, cosmetics, lighters, matches, nail files, watches, necklaces, glass objects, razors, razor blades, knives, aerosol sprays, drawstring pants, shoes, cords (telephone, call bells, etc.) cleaning wipes or other cleaning items, aluminum cans, not permanently attached wall dcor Yes   Telephone/cell phone removed as well as TV remote (batteries can be swallowed) Yes   Patient belongings removed and labeled at nurses station Yes   Excess linen is removed from room Yes   All plastic bags are removed from the room and replaced with paper trash bags Yes   Patient is in gown and using hospital socks with rubber soles Yes   No metal, hard eating utensils or hard plates are on meal tray Yes   Remove all cleaning agents used by [employer] Yes   Ensure bathroom door key is easily accessible Yes   If Crucifix is hanging on a nail, remove Crucifix as well as the nail Yes       *If any question above is answered "No," documentation is required.

## 2018-07-24 NOTE — ED Notes (Signed)
Police brought patient back. Pt was found at the store drinking a beer.

## 2018-07-24 NOTE — ED Provider Notes (Addendum)
48-year-old male complains of depression and suicidal feelings.  Does not have a plan.  History of schizophrenia on Zoloft and trazodone been drinking alcohol and using meth today.      Mental Health Problem    This is a chronic problem. The current episode started more than 1 week ago. The problem has not changed since onset.Pertinent negatives include no confusion, no delusions, no hallucinations, no self-injury and no violence. His past medical history is significant for depression and psychotropic medication treatment. His past medical history does not include seizures, CVA or TIA.        No past medical history on file.    No past surgical history on file.      No family history on file.    Social History     Socioeconomic History   ??? Marital status: SINGLE     Spouse name: Not on file   ??? Number of children: Not on file   ??? Years of education: Not on file   ??? Highest education level: Not on file   Occupational History   ??? Not on file   Social Needs   ??? Financial resource strain: Not on file   ??? Food insecurity     Worry: Not on file     Inability: Not on file   ??? Transportation needs     Medical: Not on file     Non-medical: Not on file   Tobacco Use   ??? Smoking status: Current Every Day Smoker   ??? Smokeless tobacco: Never Used   Substance and Sexual Activity   ??? Alcohol use: Yes   ??? Drug use: Yes     Types: Cocaine   ??? Sexual activity: Not on file   Lifestyle   ??? Physical activity     Days per week: Not on file     Minutes per session: Not on file   ??? Stress: Not on file   Relationships   ??? Social Product manager on phone: Not on file     Gets together: Not on file     Attends religious service: Not on file     Active member of club or organization: Not on file     Attends meetings of clubs or organizations: Not on file     Relationship status: Not on file   ??? Intimate partner violence     Fear of current or ex partner: Not on file     Emotionally abused: Not on file     Physically abused: Not on file      Forced sexual activity: Not on file   Other Topics Concern   ??? Not on file   Social History Narrative   ??? Not on file         ALLERGIES: Patient has no known allergies.    Review of Systems   Constitutional: Negative.  Negative for activity change.   HENT: Negative.    Eyes: Negative.    Respiratory: Negative.    Cardiovascular: Negative.    Gastrointestinal: Negative.    Genitourinary: Negative.    Musculoskeletal: Negative.    Skin: Negative.    Neurological: Negative.    Psychiatric/Behavioral: Negative.  Negative for confusion, hallucinations and self-injury.   All other systems reviewed and are negative.      Vitals:    07/24/18 0432   BP: 157/89   Pulse: (!) 111   Resp: 16   Temp: 99.2 ??F (37.3 ??  C)   SpO2: 97%   Weight: 117.9 kg (260 lb)   Height: 6\' 4"  (1.93 m)            Physical Exam  Vitals signs and nursing note reviewed.   Constitutional:       General: He is not in acute distress.     Appearance: He is well-developed. He is not diaphoretic.   HENT:      Head: Normocephalic and atraumatic.      Right Ear: External ear normal.      Left Ear: External ear normal.      Nose: Nose normal.      Mouth/Throat:      Pharynx: No oropharyngeal exudate.   Eyes:      General: No scleral icterus.        Right eye: No discharge.         Left eye: No discharge.      Conjunctiva/sclera: Conjunctivae normal.      Pupils: Pupils are equal, round, and reactive to light.   Neck:      Musculoskeletal: Normal range of motion and neck supple.      Vascular: No JVD.      Trachea: No tracheal deviation.   Cardiovascular:      Rate and Rhythm: Normal rate and regular rhythm.   Pulmonary:      Effort: Pulmonary effort is normal. No respiratory distress.      Breath sounds: Normal breath sounds. No stridor. No wheezing.   Chest:      Chest wall: No tenderness.   Abdominal:      General: Bowel sounds are normal. There is no distension.      Palpations: Abdomen is soft. There is no mass.       Tenderness: There is no abdominal tenderness.   Musculoskeletal: Normal range of motion.         General: No tenderness.   Skin:     General: Skin is warm and dry.      Coloration: Skin is not pale.      Findings: No erythema or rash.   Neurological:      Mental Status: He is alert and oriented to person, place, and time.      Cranial Nerves: No cranial nerve deficit.   Psychiatric:         Behavior: Behavior normal.         Thought Content: Thought content normal.          MDM  Number of Diagnoses or Management Options  Cocaine abuse [facility](HCC):   Homelessness:   Malingering:   Methamphetamine abuse [facility](HCC):   Diagnosis management comments: Awaiting psych evaluation.  Patient was [facility] days ago at [facility] found not to be suicidal or homicidal.  He was sent to rehab facility but left the facility within 24 hours    =============================  Was asked to come to the room to speak with patient.  He apparently does not want to be here because he does not feel safe here.  We apparently do not have metal detectors here.  He wants to go to [facility] because they have TVs.  Explained to the patient that he is unable to do that because that is considered an EMTALA violation but we cannot transfer psychiatric patient.  He stated to me that "this is bullshit". Can I talk to that psych doctor again?  I do not feel like I need to be here anymore.  Will request for psych evaluation again.    Briellah Baik Marzella Schleinhanh Darrah Dredge, MD  4:39 PM  ===========================  Spoke with patient again.  Stated he needs his phone because he needs to talk to his people.  In my opinion in speaking with the patient I do not feel he has any true psychiatric emergency.  His stating of depression is likely for secondary gain.  He displays signs of malingering.  He wants food.  He wants a room with TV.  He wants a better bed.  I do not feel he needs to be here.  Decertification completed.  Patient discharged.    Rafiq Bucklin Marzella Schleinhanh Chevie Birkhead, MD  4:56 PM   ======================================       Amount and/or Complexity of Data Reviewed  Clinical lab tests: ordered and reviewed  Tests in the medicine section of CPT??: ordered and reviewed    Risk of Complications, Morbidity, and/or Mortality  Presenting problems: moderate  Diagnostic procedures: moderate  Management options: moderate           Procedures

## 2018-07-24 NOTE — ED Notes (Signed)
Pt agitated and requesting to speak with nurse. Pt wanted to be transferred to psych facility. Informed patient that he has be accepted to a facility and it takes time.  Educated patient on 72 hour psych hold. Pt speaking loudly to staff, argumentative and very agitated.

## 2018-07-24 NOTE — ED Notes (Signed)
Pt white papers re-versed by Dr.Ta. Pt refusing last set of vitals. Given belongings back. Refused dc papers.

## 2018-07-24 NOTE — ED Notes (Signed)
Pt on facility phone and pacing in hallway and in bay at registration. Small child behind pt, sitter with pt. Pt is speaking loudly and cursing. Pt is being rude with staff. Pt educated the phone call is to end at this time and pt to relinquish phone to staff. Pt refusing at this time. Security called. Pt ended call after asking for number to this facility and provided number to the party on the other line. With security present, pt walked back to his room and is sitting in a chair outside of room.

## 2018-07-24 NOTE — ED Notes (Signed)
Pt walking on [address] toward the outpatient area. Security back to building at this time. [employer] notified.

## 2018-07-24 NOTE — ED Triage Notes (Signed)
Pt arrives via ems with mask in place. Pt reports he is feeling suicidal for a few weeks now. States he does not have a plan for how to hurt himself. Denies HI. Reports auditory hallucinations. Hx depression, anxiety, schizophrena. States he takes zoloft and trazadone. Reports etoh, 18 pack today. Reports using meth.

## 2018-07-24 NOTE — ED Notes (Signed)
Pt found this nurse to inquire about rules in using phone. Pt educated that rules regarding phone use for patients under mental health safety environments are mostly congruent with those at a lockdown facility. At any time the privilege for use of the phone may be removed. Pt educated on his behavior with the phone and the inappropriate nature as to why the phone is not available for his use as this time. Pt educated although his call was not 15 minutes, his behavior toward staff was aggressive and unacceptable. Pt began stating "you are one nasty mother fucking bitch. You are just an awful bitch. You fucking mother fucking nasty ass bitch." Pt initially refusing to go back to his room. Pt educated that seating in the hallway, away from his room, is not acceptable at this time. Security present. Pt walked to his room and is now seated in a chair outside of his door. Sitter remains present. Security remains present in the ER.

## 2018-07-24 NOTE — ED Notes (Signed)
Patient Safety Attendant Yes - Name: [place]   Patient Safety Attendant Oriented YES   High risk patients are in line of sight at all times Yes   Excess equipment/medical supplies not necessary for the care of the patient removed Yes   All sharp or dangerous objects are removed from room: including but not limited to belts, pens & pencils, needles, medications, cosmetics, lighters, matches, nail files, watches, necklaces, glass objects, razors, razor blades, knives, aerosol sprays, drawstring pants, shoes, cords (telephone, call bells, etc.) cleaning wipes or other cleaning items, aluminum cans, not permanently attached wall d??cor Yes   Telephone/cell phone removed as well as TV remote (batteries can be swallowed) Yes   Patient belongings removed and labeled at nurses station Yes   Excess linen is removed from room Yes   All plastic bags are removed from the room and replaced with paper trash bags Yes   Patient is in gown and using hospital socks with rubber soles Yes   No metal, hard eating utensils or hard plates are on meal tray Yes   Remove all cleaning agents used by [employer] Yes   Ensure bathroom door key is easily accessible Yes   If Crucifix is hanging on a nail, remove Crucifix as well as the nail Yes       *If any question above is answered "No," documentation is required.

## 2018-07-24 NOTE — ED Notes (Signed)
Pt requesting to speak with the doctor. Pt requesting to be transferred to [facility]. Informed patient that we are not able to do that. Pt cursing at staff and being uncooperative. Security called and MD informed.

## 2018-07-24 NOTE — ED Notes (Signed)
Pt walked out of facility doors. Security to follow in an attempt to speak with pt.

## 2018-07-24 NOTE — ED Notes (Signed)
Pt states "there's people trying to kill me".

## 2018-08-12 ENCOUNTER — Other Ambulatory Visit: Payer: Self-pay

## 2018-08-12 ENCOUNTER — Encounter (HOSPITAL_COMMUNITY): Payer: Self-pay | Admitting: Emergency Medicine

## 2018-08-12 ENCOUNTER — Emergency Department (HOSPITAL_COMMUNITY)
Admission: EM | Admit: 2018-08-12 | Discharge: 2018-08-12 | Disposition: A | Discharge status: Home / Self Care | Payer: Self-pay | Attending: Surgery | Admitting: Surgery

## 2018-08-12 ENCOUNTER — Emergency Department (HOSPITAL_COMMUNITY)
Admission: EM | Admit: 2018-08-12 | Discharge: 2018-08-13 | Disposition: A | Discharge status: Home / Self Care | Payer: Self-pay | Attending: Emergency Medicine | Admitting: Emergency Medicine

## 2018-08-12 ENCOUNTER — Emergency Department (HOSPITAL_COMMUNITY): Payer: Self-pay

## 2018-08-12 DIAGNOSIS — I10 Essential (primary) hypertension: Secondary | ICD-10-CM | POA: Insufficient documentation

## 2018-08-12 DIAGNOSIS — F329 Major depressive disorder, single episode, unspecified: Secondary | ICD-10-CM | POA: Insufficient documentation

## 2018-08-12 DIAGNOSIS — R0602 Shortness of breath: Secondary | ICD-10-CM | POA: Insufficient documentation

## 2018-08-12 DIAGNOSIS — F1910 Other psychoactive substance abuse, uncomplicated: Secondary | ICD-10-CM | POA: Insufficient documentation

## 2018-08-12 DIAGNOSIS — F17210 Nicotine dependence, cigarettes, uncomplicated: Secondary | ICD-10-CM | POA: Insufficient documentation

## 2018-08-12 DIAGNOSIS — R079 Chest pain, unspecified: Secondary | ICD-10-CM

## 2018-08-12 DIAGNOSIS — F209 Schizophrenia, unspecified: Secondary | ICD-10-CM

## 2018-08-12 DIAGNOSIS — R45851 Suicidal ideations: Secondary | ICD-10-CM | POA: Insufficient documentation

## 2018-08-12 DIAGNOSIS — F250 Schizoaffective disorder, bipolar type: Secondary | ICD-10-CM | POA: Insufficient documentation

## 2018-08-12 DIAGNOSIS — R072 Precordial pain: Secondary | ICD-10-CM | POA: Insufficient documentation

## 2018-08-12 DIAGNOSIS — Z765 Malingerer [conscious simulation]: Secondary | ICD-10-CM

## 2018-08-12 DIAGNOSIS — F1494 Cocaine use, unspecified with cocaine-induced mood disorder: Secondary | ICD-10-CM

## 2018-08-12 DIAGNOSIS — Z79899 Other long term (current) drug therapy: Secondary | ICD-10-CM | POA: Insufficient documentation

## 2018-08-12 DIAGNOSIS — F1414 Cocaine abuse with cocaine-induced mood disorder: Secondary | ICD-10-CM | POA: Diagnosis present

## 2018-08-12 DIAGNOSIS — R0789 Other chest pain: Secondary | ICD-10-CM | POA: Insufficient documentation

## 2018-08-12 HISTORY — DX: Pure hypercholesterolemia, unspecified: E78.00

## 2018-08-12 HISTORY — DX: Essential (primary) hypertension: I10

## 2018-08-12 HISTORY — DX: Bipolar disorder, unspecified: F31.9

## 2018-08-12 HISTORY — DX: Major depressive disorder, single episode, unspecified: F32.9

## 2018-08-12 HISTORY — DX: Schizophrenia, unspecified: F20.9

## 2018-08-12 LAB — BASIC METABOLIC PANEL
Anion gap: 11 (ref 5–15)
BUN: 14 mg/dL (ref 6–20)
CO2: 22 mmol/L (ref 22–32)
Calcium: 8.7 mg/dL — ABNORMAL LOW (ref 8.9–10.3)
Chloride: 106 mmol/L (ref 98–111)
Creatinine, Ser: 1.23 mg/dL (ref 0.61–1.24)
GFR calc Af Amer: >60 mL/min (ref >60)
GFR calc non Af Amer: >60 mL/min (ref >60)
Glucose, Bld: 116 mg/dL — ABNORMAL HIGH (ref 70–99)
Potassium: 3.6 mmol/L (ref 3.5–5.1)
Sodium: 139 mmol/L (ref 135–145)

## 2018-08-12 LAB — RAPID URINE DRUG SCREEN, HOSP PERFORMED
Amphetamines: NOT DETECTED
Barbiturates: NOT DETECTED
Benzodiazepines: NOT DETECTED
Cocaine: NOT DETECTED
Opiates: NOT DETECTED
Tetrahydrocannabinol: NOT DETECTED

## 2018-08-12 LAB — CBC
HCT: 40.6 % (ref 39.0–52.0)
Hemoglobin: 13.6 g/dL (ref 13.0–17.0)
MCH: 30.4 pg (ref 26.0–34.0)
MCHC: 33.5 g/dL (ref 30.0–36.0)
MCV: 90.8 fL (ref 80.0–100.0)
Platelets: 224 10*3/uL (ref 150–400)
RBC: 4.47 MIL/uL (ref 4.22–5.81)
RDW: 12.8 % (ref 11.5–15.5)
WBC: 9.5 10*3/uL (ref 4.0–10.5)
nRBC: 0 % (ref 0.0–0.2)

## 2018-08-12 LAB — ETHANOL: Alcohol, Ethyl (B): 44 mg/dL — ABNORMAL HIGH (ref <10)

## 2018-08-12 LAB — TROPONIN I (HIGH SENSITIVITY)
Troponin I (High Sensitivity): 6 ng/L (ref <18)
Troponin I (High Sensitivity): 5 ng/L (ref <18)

## 2018-08-12 MED ORDER — SERTRALINE HCL 100 MG PO TABS
100.0000 mg | ORAL_TABLET | Freq: Every day | ORAL | Status: DC
  Administered 2018-08-12: 02:00:00 100 mg via ORAL
  Filled 2018-08-12: qty 1

## 2018-08-12 MED ORDER — HYDROXYZINE HCL 50 MG PO TABS: 50.0000 mg | ORAL_TABLET | Freq: Three times a day (TID) | ORAL | Status: DC | PRN

## 2018-08-12 MED ORDER — NICOTINE 14 MG/24HR TD PT24: 14.0000 mg | MEDICATED_PATCH | Freq: Every day | TRANSDERMAL | Status: DC

## 2018-08-12 NOTE — ED Notes (Signed)
Diet was ordered for Breakfast. 

## 2018-08-12 NOTE — ED Provider Notes (Signed)
[facility] EMERGENCY DEPARTMENT Provider Note   CSN: [id number] Arrival date & time: 08/12/18  0008    History   Chief Complaint Chief Complaint  Patient presents with  . Chest Pain  . Suicidal    HPI Craig Hogan is a 48 y.o. male.  HPI: A 48 year old patient with a history of hypertension presents for evaluation of chest pain. Initial onset of pain was approximately 3-6 hours ago. The patient's chest pain is described as heaviness/pressure/tightness and is not worse with exertion. The patient's chest pain is not middle- or left-sided, is not well-localized, is not sharp and does not radiate to the arms/jaw/neck. The patient does not complain of nausea and denies diaphoresis. The patient has smoked in the past 90 days. The patient has no history of stroke, has no history of peripheral artery disease, denies any history of treated diabetes, has no relevant family history of coronary artery disease (first degree relative at less than age 48), has no history of hypercholesterolemia and does not have an elevated BMI (>=30).   The history is provided by the patient.   Patient reports he has been having chest pain over the past several hours.  He reports it is pressure.  He reports shortness of breath.  He [place] got [place] days ago.  He has been drinking alcohol heavily and abusing cocaine.  Last use of cocaine was approximately 24 hours ago.  He now reports he is feeling suicidal, but has no plan to harm himself. Past Medical History:  Diagnosis Date  . Bipolar affective disorder (HCC)   . Hypercholesteremia   . Hypertension   . MDD (major depressive disorder)   . Schizophrenia [facility](HCC)     Patient Active Problem List   Diagnosis Date Noted  . Cocaine abuse with cocaine-induced mood disorder (HCC) 06/06/2017    Past Surgical History:  Procedure Laterality Date  . NO PAST SURGERIES          Home Medications    Prior to  Admission medications   Medication Sig Start Date End Date Taking? Authorizing Provider  hydrOXYzine (ATARAX/VISTARIL) 50 MG tablet Take 50 mg by mouth every 8 (eight) hours as needed for anxiety.    [provider]  Multiple Vitamin (MULTIVITAMIN WITH MINERALS) TABS tablet Take 1 tablet by mouth daily.    [provider]  sertraline (ZOLOFT) 100 MG tablet Take 100 mg by mouth at bedtime.    [provider]    Family History History reviewed. No pertinent family history.  Social History Social History   Tobacco Use  . Smoking status: Current Every Day Smoker    Packs/day: 0.50    Types: Cigarettes  . Smokeless tobacco: Never Used  Substance Use Topics  . Alcohol use: Yes    Comment: 6-12 pack a day  . Drug use: Yes    Types: Cocaine     Allergies   Patient has no known allergies.   Review of Systems Review of Systems  Constitutional: Negative for fever.  Respiratory: Positive for shortness of breath.   Cardiovascular: Positive for chest pain.  Gastrointestinal: Negative for vomiting.  Psychiatric/Behavioral: Positive for suicidal ideas.  All other systems reviewed and are negative.    Physical Exam Updated Vital Signs BP 129/88   Pulse 88   Temp 98.6 F (37 C) (Oral)   Resp (!) 24   Ht 1.956 m (6\' 5" )   Wt 113.4 kg   SpO2 95%  BMI 29.65 kg/m   Physical Exam CONSTITUTIONAL: Well developed/well nourished, no acute distress wearing sunglasses HEAD: Normocephalic/atraumatic EYES: Wearing glasses ENMT: Mucous membranes moist NECK: supple no meningeal signs SPINE/BACK:entire spine nontender CV: S1/S2 noted, no murmurs/rubs/gallops noted LUNGS: Lungs are clear to auscultation bilaterally, no apparent distress ABDOMEN: soft, nontender, no rebound or guarding, bowel sounds noted throughout abdomen GU:no cva tenderness NEURO: Pt is awake/alert/appropriate, moves all extremitiesx4.  No facial droop.   EXTREMITIES: pulses normal/equal,  full ROM SKIN: warm, color normal PSYCH: no abnormalities of mood noted, alert and oriented to situation   ED Treatments / Results  Labs (all labs ordered are listed, but only abnormal results are displayed) Labs Reviewed  BASIC METABOLIC PANEL - Abnormal; Notable for the following components:      Result Value   Glucose, Bld 116 (*)    Calcium 8.7 (*)    All other components within normal limits  ETHANOL - Abnormal; Notable for the following components:   Alcohol, Ethyl (B) 44 (*)    All other components within normal limits  CBC  TROPONIN I (HIGH SENSITIVITY)  TROPONIN I (HIGH SENSITIVITY)  RAPID URINE DRUG SCREEN, HOSP PERFORMED    EKG ED ECG REPORT   Date: 08/12/2018 0016  Rate: 99  Rhythm: normal sinus rhythm  QRS Axis: left  Intervals: normal  ST/T Wave abnormalities: nonspecific ST changes  Conduction Disutrbances:nonspecific intraventricular conduction delay  Narrative Interpretation:   Old EKG Reviewed: unchanged  I have personally reviewed the EKG tracing and agree with the computerized printout as noted.  Radiology Dg Chest Portable 1 View  Result Date: 08/12/2018 CLINICAL DATA:  Chest pain x2 hours. EXAM: PORTABLE CHEST 1 VIEW COMPARISON:  None. FINDINGS: The heart size and mediastinal contours are within normal limits. Both lungs are clear. The visualized skeletal structures are unremarkable. IMPRESSION: No active disease. Electronically Signed   By: Constance Holster M.D.   On: 08/12/2018 01:04    Procedures Procedures  Medications Ordered in ED Medications  hydrOXYzine (ATARAX/VISTARIL) tablet 50 mg (has no administration in time range)  sertraline (ZOLOFT) tablet 100 mg (100 mg Oral Given 08/12/18 0205)     Initial Impression / Assessment and Plan / ED Course  I have reviewed the triage vital signs and the nursing notes.  Pertinent labs & imaging results that were available during my care of the patient were reviewed by me and considered in my  medical decision making (see chart for details).      1:43 AM No acute EKG changes, chest x-ray is negative.  Will get repeat troponin. He is also suicidal and will require psych consult 3:45 AM No significant change in troponin.  EKG is unremarkable.  Low suspicion for ACS/PE/dissection at this time. Patient is resting comfortably, no acute distress, walking in the ER no distress will consult psychiatry  Pt medically stable Final Clinical Impressions(s) / ED Diagnoses   Final diagnoses:  Suicidal ideation  Precordial pain  Substance abuse [facility])    ED Discharge Orders    None       Ripley Fraise, MD 08/12/18 [phone]

## 2018-08-12 NOTE — ED Provider Notes (Signed)
Patient evaluated by Marvia Pickles, FNP with psychiatry who evaluated the patient for suicidal ideations.  Per chart review, patient has been seen at several different hospitals for the same.  He does not take his medications.  Patient has been psychiatrically cleared for discharge and outpatient follow-up.  Patient would not answer any of my questions and requested his discharge paperwork so he can leave.   Frederica Kuster, PA-C 08/12/18 1456    Milton Ferguson, MD 08/16/18 1310

## 2018-08-12 NOTE — Discharge Instructions (Addendum)
Please return to the closest emergency department if you develop any new or worsening symptoms, including plan for suicide, hurting or killing anyone else, or any other new or concerning symptoms.

## 2018-08-12 NOTE — ED Notes (Signed)
Patient refused a snack. A Diet was ordered for Lunch.

## 2018-08-12 NOTE — ED Triage Notes (Signed)
[place]IB [facility] [place] [facility] [place] with c/o of chest pain X 2 hrs. Pt reports cocaine use 5-6 times per week with last use yesterday. Report heavy alcohol use tonight. Hx of Bipolar and Depression and non-compliant with medications. Pt stating he's been out of his medications X 1 month. Received 324 asp and 3 nitro PTA. Pain down to 4/10 on arrival.

## 2018-08-12 NOTE — Consult Note (Signed)
Telepsych Consultation   Reason for Consult:  Substance abuse and SI Referring Physician:  EDP Location of Patient:  Location of Provider: Behavioral Health TTS Department  Patient Identification: Craig Hogan MRN:  [id number] Principal Diagnosis: Malingering Diagnosis:  Principal Problem:   Malingering Active Problems:   Cocaine abuse with cocaine-induced mood disorder (HCC)   Total Time spent with patient: 30 minutes  Subjective:   Craig Hogan is a 48 y.o. male patient reports that he is homeless and he has been off of his medications.  He reports that he was in the hospital about 1 week ago but would not tell me which hospital he was seen.  Patient reports that he does bounce around and is moving to [place] from [place] way to [place].  He states that [facility].  He reports having cocaine abuse but his UDS is negative.  He reports that he has been on medications in the past of Risperdal, trazodone, and Zoloft.  He reports having a history of depression, schizophrenia, bipolar, and anxiety.  He states that he has a lot of voices that are inside and outside his head and he recognizes them when he asked to detail them he just says there is a bunch of them.  He fell he reports that he is homeless and he did not know which way to go so he came to the hospital again.  HPI: Patient is a 48 year old male that initially came to the ED for chest pain.  He reported that he had just gotten in the [place] few days ago and has been drinking alcohol and abusing cocaine.  Patient notes time reported SI and auditory hallucinations.  Upon chart review it is well-documented that this patient bounces from hospital to hospital seeking secondary gain and uses almost the exact same scenario for being in the hospital.  He reports hallucinations, SI, and substance abuse.  Patient does not follow-up with any outpatient resources, does not stay compliant  with his medications, and is been found to be seeking housing.  Patient states that he has been in [place] and in [place] and last year was in [place] and in [place].  Patient is seen by me via tele-psych and have consulted with Dr. Lucianne MussKumar.  At this time patient does not meet inpatient criteria and is psychiatrically cleared.  Patient does not appear to be responding to any internal stimuli and is lying in the bed.  Patient does not want to engage in conversation about his complaints.  Patient presents as he has in a lot of the notes that are documented in the chart review of seeking secondary gain and malingering in the hospital.  Past Psychiatric History: Depression, schizophrenia, bipolar disorder, anxiety, substance abuse  Risk to Self: Suicidal Ideation: Yes-Currently Present Suicidal Intent: Yes-Currently Present Is patient at risk for suicide?: Yes Suicidal Plan?: Yes-Currently Present Specify Current Suicidal Plan: Overdose Access to Means: No What has been your use of drugs/alcohol within the last 12 months?: Cocaine and ETOH How many times?: (2-3 times) Other Self Harm Risks: SA Triggers for Past Attempts: Unpredictable Intentional Self Injurious Behavior: None Risk to Others: Homicidal Ideation: No Thoughts of Harm to Others: No Current Homicidal Intent: No Current Homicidal Plan: No Access to Homicidal Means: No Identified Victim: No one History of harm to others?: No Assessment of Violence: None Noted Violent Behavior Description: "Not really" Does patient have access to weapons?: No Criminal Charges Pending?: No  Does patient have a court date: No Prior Inpatient Therapy: Prior Inpatient Therapy: Yes Prior Therapy Dates: 3 weeks ago Prior Therapy Facilty/Provider(s): [facility] Reason for Treatment: Anxiety, depression Prior Outpatient Therapy: Prior Outpatient Therapy: No Does patient have an ACCT team?: No Does patient have Intensive In-House  Services?  : No Does patient have [facility] services? : No Does patient have P4CC services?: No  Past Medical History:  Past Medical History:  Diagnosis Date  . Bipolar affective disorder ([place])   . Hypercholesteremia   . Hypertension   . MDD (major depressive disorder)   . Schizophrenia [facility])     Past Surgical History:  Procedure Laterality Date  . NO PAST SURGERIES     Family History: History reviewed. No pertinent family history. Family Psychiatric  History: None reported Social History:  Social History   Substance and Sexual Activity  Alcohol Use Yes   Comment: 6-12 pack a day     Social History   Substance and Sexual Activity  Drug Use Yes  . Types: Cocaine    Social History   Socioeconomic History  . Marital status: Single    Spouse name: Not on file  . Number of children: Not on file  . Years of education: Not on file  . Highest education level: Not on file  Occupational History  . Not on file  Social Needs  . Financial resource strain: Not on file  . Food insecurity    Worry: Not on file    Inability: Not on file  . Transportation needs    Medical: Not on file    Non-medical: Not on file  Tobacco Use  . Smoking status: Current Every Day Smoker    Packs/day: 0.50    Types: Cigarettes  . Smokeless tobacco: Never Used  Substance and Sexual Activity  . Alcohol use: Yes    Comment: 6-12 pack a day  . Drug use: Yes    Types: Cocaine  . Sexual activity: Not on file  Lifestyle  . Physical activity    Days per week: Not on file    Minutes per session: Not on file  . Stress: Not on file  Relationships  . Social Herbalist on phone: Not on file    Gets together: Not on file    Attends religious service: Not on file    Active member of club or organization: Not on file    Attends meetings of clubs or organizations: Not on file    Relationship status: Not on file  Other Topics Concern  . Not on file  Social History Narrative  . Not on  file   Additional Social History:    Allergies:  No Known Allergies  Labs:  Results for orders placed or performed during the hospital encounter of 08/12/18 (from the past 48 hour(s))  Basic metabolic panel     Status: Abnormal   Collection Time: 08/12/18 12:18 AM  Result Value Ref Range   Sodium 139 135 - 145 mmol/L   Potassium 3.6 3.5 - 5.1 mmol/L   Chloride 106 98 - 111 mmol/L   CO2 22 22 - 32 mmol/L   Glucose, Bld 116 (H) 70 - 99 mg/dL   BUN 14 6 - 20 mg/dL   Creatinine, Ser 1.23 0.61 - 1.24 mg/dL   Calcium 8.7 (L) 8.9 - 10.3 mg/dL   GFR calc non Af Amer >60 >60 mL/min   GFR calc Af Amer >60 >60 mL/min  Anion gap 11 5 - 15    Comment: Performed at [facility], [address] [place], [place] [address]  CBC     Status: None   Collection Time: 08/12/18 12:18 AM  Result Value Ref Range   WBC 9.5 4.0 - 10.5 K/uL   RBC 4.47 4.22 - 5.81 MIL/uL   Hemoglobin 13.6 13.0 - 17.0 g/dL   HCT 84.140.6 32.439.0 - 40.152.0 %   MCV 90.8 80.0 - 100.0 fL   MCH 30.4 26.0 - 34.0 pg   MCHC 33.5 30.0 - 36.0 g/dL   RDW 02.712.8 25.311.5 - 66.415.5 %   Platelets 224 150 - 400 K/uL   nRBC 0.0 0.0 - 0.2 %    Comment: Performed at [facility], [address] [place], [place] [address]  Troponin I (High Sensitivity)     Status: None   Collection Time: 08/12/18 12:18 AM  Result Value Ref Range   Troponin I (High Sensitivity) 6 <18 ng/L    Comment: (NOTE) Elevated high sensitivity troponin I (hsTnI) values and significant  changes across serial measurements may suggest ACS but many other  chronic and acute conditions are known to elevate hsTnI results.  Refer to the "Links" section for chest pain algorithms and additional  guidance. Performed at [facility], [address] [place], [place] [address]   Ethanol     Status: Abnormal   Collection Time: 08/12/18 12:18 AM  Result Value Ref Range   Alcohol, Ethyl (B) 44 (H) <10 mg/dL    Comment: (NOTE) Lowest detectable limit for serum alcohol  is 10 mg/dL. For medical purposes only. Performed at [facility], [address] [place], [place] [address]   Rapid urine drug screen (hospital performed)     Status: None   Collection Time: 08/12/18  2:00 AM  Result Value Ref Range   Opiates NONE DETECTED NONE DETECTED   Cocaine NONE DETECTED NONE DETECTED   Benzodiazepines NONE DETECTED NONE DETECTED   Amphetamines NONE DETECTED NONE DETECTED   Tetrahydrocannabinol NONE DETECTED NONE DETECTED   Barbiturates NONE DETECTED NONE DETECTED    Comment: (NOTE) DRUG SCREEN FOR MEDICAL PURPOSES ONLY.  IF CONFIRMATION IS NEEDED FOR ANY PURPOSE, NOTIFY LAB WITHIN 5 DAYS. LOWEST DETECTABLE LIMITS FOR URINE DRUG SCREEN Drug Class                     Cutoff (ng/mL) Amphetamine and metabolites    1000 Barbiturate and metabolites    200 Benzodiazepine                 200 Tricyclics and metabolites     300 Opiates and metabolites        300 Cocaine and metabolites        300 THC                            50 Performed at [facility], [address] [place], [place] [address]   Troponin I (High Sensitivity)     Status: None   Collection Time: 08/12/18  2:09 AM  Result Value Ref Range   Troponin I (High Sensitivity) 5 <18 ng/L    Comment: (NOTE) Elevated high sensitivity troponin I (hsTnI) values and significant  changes across serial measurements may suggest ACS but many other  chronic and acute conditions are known to elevate hsTnI results.  Refer to the "Links"  section for chest pain algorithms and additional  guidance. Performed at [facility], [address] [place], [place] [address]     Medications:  Current Facility-Administered Medications  Medication Dose Route Frequency Provider Last Rate Last Dose  . hydrOXYzine (ATARAX/VISTARIL) tablet 50 mg  50 mg Oral Q8H PRN Zadie RhineWickline, Donald, MD      . nicotine (NICODERM CQ - dosed in mg/24 hours) patch 14 mg  14 mg Transdermal Daily Zadie RhineWickline, Donald, MD    Stopped at 08/12/18 0930  . sertraline (ZOLOFT) tablet 100 mg  100 mg Oral QHS Zadie RhineWickline, Donald, MD   100 mg at 08/12/18 0205   No current outpatient medications on file.    Musculoskeletal: Strength & Muscle Tone: within normal limits Gait & Station: normal Patient leans: N/A  Psychiatric Specialty Exam: Physical Exam  Nursing note and vitals reviewed. Constitutional: He is oriented to person, place, and time. He appears well-developed and well-nourished.  Cardiovascular: Normal rate.  Respiratory: Effort normal.  Musculoskeletal: Normal range of motion.  Neurological: He is alert and oriented to person, place, and time.  Skin: Skin is warm.    Review of Systems  Constitutional: Negative.   HENT: Negative.   Eyes: Negative.   Respiratory: Negative.   Cardiovascular: Negative.   Gastrointestinal: Negative.   Genitourinary: Negative.   Musculoskeletal: Negative.   Skin: Negative.   Neurological: Negative.   Endo/Heme/Allergies: Negative.   Psychiatric/Behavioral: Positive for depression, hallucinations (reports but does not appear to be responding to ant internal stimului), substance abuse and suicidal ideas (Vague and chronic seeking secondary gain).    Blood pressure (!) 151/87, pulse 66, temperature 97.8 F (36.6 C), temperature source Oral, resp. rate 17, height 6\' 5"  (1.956 m), weight 113.4 kg, SpO2 97 %.Body mass index is 29.65 kg/m.  General Appearance: Casual  Eye Contact:  Minimal  Speech:  Clear and Coherent and Normal Rate  Volume:  Normal  Mood:  Depressed  Affect:  Flat  Thought Process:  Coherent and Descriptions of Associations: Intact  Orientation:  Full (Time, Place, and Person)  Thought Content:  WDL  Suicidal Thoughts:  Chronically reports SI with documented history  Homicidal Thoughts:  No  Memory:  Immediate;   Good Recent;   Good Remote;   Good  Judgement:  Fair  Insight:  Fair  Psychomotor Activity:  Normal  Concentration:  Concentration:  Good and Attention Span: Good  Recall:  Good  Fund of Knowledge:  Good  Language:  Good  Akathisia:  No  Handed:  Right  AIMS (if indicated):     Assets:  Communication Skills Desire for Improvement Resilience Social Support Transportation  ADL's:  Intact  Cognition:  WNL  Sleep:        Treatment Plan Summary: [facility]  Seek housing through homeless shelter  Disposition: No evidence of imminent risk to self or others at present.   Patient does not meet criteria for psychiatric inpatient admission. Supportive therapy provided about ongoing stressors. Discussed crisis plan, support from social network, calling 911, coming to the Emergency Department, and calling Suicide Hotline.  This service was provided via telemedicine using a 2-way, interactive audio and video technology.  Names of all persons participating in this telemedicine service and their role in this encounter. Name: Craig Hogan Role: Patient  Name: Reola Calkinsravis Srikar Chiang NP Role: Provider  Name:  Role:   Name:  Role:     Maryfrances Bunnellravis B Blinda Turek, FNP 08/12/2018 3:40 PM

## 2018-08-12 NOTE — ED Notes (Signed)
Pt changed into paper scrubs, wanded by security and all belongings inventoried.

## 2018-08-12 NOTE — BH Assessment (Addendum)
Tele Assessment Note   Patient Name: Craig Hogan MRN: [id number] Referring Physician: Dr. Zadie Rhineonald Wickline Location of Patient: [facility] Location of Provider: Behavioral Health TTS Department  Craig Hogan is an 48 y.o. male.  -Clinician reviewed note by Dr. Bebe ShaggyWickline.  Patient came in initially with chest pain.  He reports he just got to [place] several days ago.  He has been drinking alcohol heavily and abusing cocaine.  Last use of cocaine was approximately 24 hours ago.  Patient is slightly agitated during assessment.  He looks away most of the assessment or has an irritated tone in his voice.  When he was being asked about how much ETOH he drank he became irritated and said "I am always hearing voices talking all the time even when I am sober and it is annoying."  He describes having these auditory hallucinations over the last year.  They are voices "that follow me around and I can't tell what they are saying."  Patient says he is feeling like killing himself.  He has been off medications for the last month.  He however says he would kill himself by overdosing on medications.  Patient has had 3-4 previous suicide attempts.    Patient says he has HI "sometimes" but has no current target or intention.  Patient denies any visual hallucinations.  Patient says that he drinks 18 beers a day on average.  He drank prior to arrival.  He also uses cocaine on regular basis.  Patient says that he is from [place] originally.  He is homeless in [place] and has been here for about 3 days.  He says he has a home in [place] but did not say why he ventured [place].  Patient has no outpatient care.  [facility] three weeks ago.  -Clinician discussed patient care with Nira ConnJason Berry, FNP who recommends observation and review by psychiatry later in the day.  Clinician informed Dr. Bebe ShaggyWickline of disposition.  Diagnosis: F31.5 Bipolar 1 d/o most recent episode  depressed w/ psychotic features  Past Medical History:  Past Medical History:  Diagnosis Date  . Bipolar affective disorder (HCC)   . Hypercholesteremia   . Hypertension   . MDD (major depressive disorder)   . Schizophrenia [facility](HCC)     Past Surgical History:  Procedure Laterality Date  . NO PAST SURGERIES      Family History: History reviewed. No pertinent family history.  Social History:  reports that he has been smoking cigarettes. He has been smoking about 0.50 packs per day. He has never used smokeless tobacco. He reports current alcohol use. He reports current drug use. Drug: Cocaine.  Additional Social History:  Alcohol / Drug Use Pain Medications: None Prescriptions: Has been off medications for a month Over the Counter: None History of alcohol / drug use?: Yes Longest period of sobriety (when/how long): Two years Withdrawal Symptoms: Tremors, Patient aware of relationship between substance abuse and physical/medical complications, Weakness, Diarrhea Substance #1 Name of Substance 1: ETOH 1 - Age of First Use: 48 years of age 67 - Amount (size/oz): About 18 beers 1 - Frequency: Daily 1 - Duration: ongoing 1 - Last Use / Amount: 06/25 Substance #2 Name of Substance 2: Cocaine 2 - Age of First Use: 20's 2 - Amount (size/oz): Varies 2 - Frequency: 4-5 days in a week 2 - Duration: off and on 2 - Last Use / Amount: 06/25  CIWA: CIWA-Ar BP: (!) 143/77 Pulse Rate: 69 COWS:  Allergies: No Known Allergies  Home Medications: (Not in a hospital admission)   OB/GYN Status:  No LMP for male patient.  General Assessment Data Location of Assessment: [facility] ED TTS Assessment: In system Is this a Tele or Face-to-Face Assessment?: Tele Assessment Is this an Initial Assessment or a Re-assessment for this encounter?: Initial Assessment Patient Accompanied by:: N/A Language Other than English: No Living Arrangements: Homeless/Shelter What gender do you identify as?:  Male Marital status: Single Pregnancy Status: No Living Arrangements: Other (Comment)(Pt is homeless) Can pt return to current living arrangement?: Yes Admission Status: Voluntary Is patient capable of signing voluntary admission?: Yes Referral Source: Self/Family/Friend Insurance type: self pay     Crisis Care Plan Living Arrangements: Other (Comment)(Pt is homeless) Name of Psychiatrist: None Name of Therapist: None  Education Status Is patient currently in school?: No Is the patient employed, unemployed or receiving disability?: Unemployed  Risk to self with the past 6 months Suicidal Ideation: Yes-Currently Present Has patient been a risk to self within the past 6 months prior to admission? : Yes Suicidal Intent: Yes-Currently Present Has patient had any suicidal intent within the past 6 months prior to admission? : Yes Is patient at risk for suicide?: Yes Suicidal Plan?: Yes-Currently Present Has patient had any suicidal plan within the past 6 months prior to admission? : No Specify Current Suicidal Plan: Overdose Access to Means: No What has been your use of drugs/alcohol within the last 12 months?: Cocaine and ETOH Previous Attempts/Gestures: Yes How many times?: (2-3 times) Other Self Harm Risks: SA Triggers for Past Attempts: Unpredictable Intentional Self Injurious Behavior: None Family Suicide History: No Recent stressful life event(s): Turmoil (Comment) Persecutory voices/beliefs?: Yes Depression: Yes Depression Symptoms: Isolating, Loss of interest in usual pleasures, Feeling worthless/self pity, Despondent, Insomnia Substance abuse history and/or treatment for substance abuse?: Yes Suicide prevention information given to non-admitted patients: Not applicable  Risk to Others within the past 6 months Homicidal Ideation: No Does patient have any lifetime risk of violence toward others beyond the six months prior to admission? : No Thoughts of Harm to Others:  No Current Homicidal Intent: No Current Homicidal Plan: No Access to Homicidal Means: No Identified Victim: No one History of harm to others?: No Assessment of Violence: None Noted Violent Behavior Description: "Not really" Does patient have access to weapons?: No Criminal Charges Pending?: No Does patient have a court date: No Is patient on probation?: No  Psychosis Hallucinations: Auditory(Unintelligible) Delusions: None noted  Mental Status Report Appearance/Hygiene: Disheveled, Body odor Eye Contact: Poor Motor Activity: Freedom of movement, Unremarkable Speech: Logical/coherent, Rapid Level of Consciousness: Quiet/awake, Irritable Mood: Depressed, Anxious, Helpless, Sad Affect: Apprehensive, Anxious Anxiety Level: Severe Thought Processes: Coherent, Relevant Judgement: Impaired Orientation: Appropriate for developmental age Obsessive Compulsive Thoughts/Behaviors: None  Cognitive Functioning Concentration: Fair Memory: Recent Intact, Remote Intact Is patient IDD: No Insight: Fair Impulse Control: Fair Appetite: Poor Have you had any weight changes? : No Change Sleep: Decreased Total Hours of Sleep: (Hard time falling asleep) Vegetative Symptoms: Staying in bed  ADLScreening [facility] Assessment Services) Patient's cognitive ability adequate to safely complete daily activities?: Yes Patient able to express need for assistance with ADLs?: Yes Independently performs ADLs?: Yes (appropriate for developmental age)  Prior Inpatient Therapy Prior Inpatient Therapy: Yes Prior Therapy Dates: 3 weeks ago Prior Therapy Facilty/Provider(s): [facility] Reason for Treatment: Anxiety, depression  Prior Outpatient Therapy Prior Outpatient Therapy: No Does patient have an ACCT team?: No Does patient have Intensive In-House  Services?  : No Does patient have [facility] services? : No Does patient have P4CC services?: No  ADL Screening (condition at time of  admission) Patient's cognitive ability adequate to safely complete daily activities?: Yes Is the patient deaf or have difficulty hearing?: No Does the patient have difficulty seeing, even when wearing glasses/contacts?: Yes Does the patient have difficulty concentrating, remembering, or making decisions?: No Patient able to express need for assistance with ADLs?: Yes Does the patient have difficulty dressing or bathing?: No Independently performs ADLs?: Yes (appropriate for developmental age) Does the patient have difficulty walking or climbing stairs?: No Weakness of Legs: None Weakness of Arms/Hands: None       Abuse/Neglect Assessment (Assessment to be complete while patient is alone) Abuse/Neglect Assessment Can Be Completed: Yes Physical Abuse: Denies Verbal Abuse: Denies Sexual Abuse: Denies Exploitation of patient/patient's resources: Denies Self-Neglect: Denies     Merchant navy officerAdvance Directives (For Healthcare) Does Patient Have a Medical Advance Directive?: No Would patient like information on creating a medical advance directive?: No - Patient declined          Disposition:  Disposition Initial Assessment Completed for this Encounter: Yes Patient referred to: Other (Comment)(Observation for safety)  This service was provided via telemedicine using a 2-way, interactive audio and video technology.  Names of all persons participating in this telemedicine service and their role in this encounter. Name: Craig Hogan Role: patient  Name: Craig StallionMarcus Emi Hogan, M.S. LCAS QP Role: clinician  Name:  Role:   Name:  Role:     Craig Hogan, Craig Hogan 08/12/2018 5:25 AM

## 2018-08-12 NOTE — ED Notes (Signed)
RN Claiborne Billings to draw the labs

## 2018-08-13 ENCOUNTER — Other Ambulatory Visit: Payer: Self-pay

## 2018-08-13 ENCOUNTER — Emergency Department (HOSPITAL_COMMUNITY): Payer: Self-pay

## 2018-08-13 ENCOUNTER — Emergency Department (HOSPITAL_COMMUNITY)
Admission: EM | Admit: 2018-08-13 | Discharge: 2018-08-14 | Disposition: A | Discharge status: Other Acute Inpatient Hospital | Payer: Self-pay | Attending: Emergency Medicine | Admitting: Emergency Medicine

## 2018-08-13 ENCOUNTER — Encounter (HOSPITAL_COMMUNITY): Payer: Self-pay | Admitting: Emergency Medicine

## 2018-08-13 ENCOUNTER — Encounter (HOSPITAL_COMMUNITY): Payer: Self-pay | Admitting: *Deleted

## 2018-08-13 DIAGNOSIS — F1020 Alcohol dependence, uncomplicated: Secondary | ICD-10-CM | POA: Insufficient documentation

## 2018-08-13 DIAGNOSIS — F1420 Cocaine dependence, uncomplicated: Secondary | ICD-10-CM | POA: Insufficient documentation

## 2018-08-13 DIAGNOSIS — F259 Schizoaffective disorder, unspecified: Secondary | ICD-10-CM

## 2018-08-13 DIAGNOSIS — I10 Essential (primary) hypertension: Secondary | ICD-10-CM | POA: Insufficient documentation

## 2018-08-13 DIAGNOSIS — F1414 Cocaine abuse with cocaine-induced mood disorder: Secondary | ICD-10-CM | POA: Diagnosis present

## 2018-08-13 DIAGNOSIS — F1010 Alcohol abuse, uncomplicated: Secondary | ICD-10-CM

## 2018-08-13 DIAGNOSIS — R45851 Suicidal ideations: Secondary | ICD-10-CM | POA: Insufficient documentation

## 2018-08-13 DIAGNOSIS — F1410 Cocaine abuse, uncomplicated: Secondary | ICD-10-CM

## 2018-08-13 DIAGNOSIS — F251 Schizoaffective disorder, depressive type: Secondary | ICD-10-CM | POA: Insufficient documentation

## 2018-08-13 DIAGNOSIS — F17210 Nicotine dependence, cigarettes, uncomplicated: Secondary | ICD-10-CM | POA: Insufficient documentation

## 2018-08-13 DIAGNOSIS — Z03818 Encounter for observation for suspected exposure to other biological agents ruled out: Secondary | ICD-10-CM | POA: Insufficient documentation

## 2018-08-13 DIAGNOSIS — R45850 Homicidal ideations: Secondary | ICD-10-CM | POA: Insufficient documentation

## 2018-08-13 LAB — COMPREHENSIVE METABOLIC PANEL
ALT: 28 U/L (ref 0–44)
AST: 25 U/L (ref 15–41)
Albumin: 3.6 g/dL (ref 3.5–5.0)
Alkaline Phosphatase: 83 U/L (ref 38–126)
Anion gap: 11 (ref 5–15)
BUN: 11 mg/dL (ref 6–20)
CO2: 25 mmol/L (ref 22–32)
Calcium: 9.2 mg/dL (ref 8.9–10.3)
Chloride: 100 mmol/L (ref 98–111)
Creatinine, Ser: 1.35 mg/dL — ABNORMAL HIGH (ref 0.61–1.24)
GFR calc Af Amer: >60 mL/min (ref >60)
GFR calc non Af Amer: >60 mL/min (ref >60)
Glucose, Bld: 95 mg/dL (ref 70–99)
Potassium: 3.6 mmol/L (ref 3.5–5.1)
Sodium: 136 mmol/L (ref 135–145)
Total Bilirubin: 0.4 mg/dL (ref 0.3–1.2)
Total Protein: 6.9 g/dL (ref 6.5–8.1)

## 2018-08-13 LAB — CBC
HCT: 42.1 % (ref 39.0–52.0)
HCT: 42.4 % (ref 39.0–52.0)
Hemoglobin: 13.8 g/dL (ref 13.0–17.0)
Hemoglobin: 14.1 g/dL (ref 13.0–17.0)
MCH: 30.4 pg (ref 26.0–34.0)
MCH: 30.5 pg (ref 26.0–34.0)
MCHC: 32.8 g/dL (ref 30.0–36.0)
MCHC: 33.3 g/dL (ref 30.0–36.0)
MCV: 91.4 fL (ref 80.0–100.0)
MCV: 92.9 fL (ref 80.0–100.0)
Platelets: 231 10*3/uL (ref 150–400)
Platelets: 245 10*3/uL (ref 150–400)
RBC: 4.53 MIL/uL (ref 4.22–5.81)
RBC: 4.64 MIL/uL (ref 4.22–5.81)
RDW: 13 % (ref 11.5–15.5)
RDW: 13.2 % (ref 11.5–15.5)
WBC: 8.9 10*3/uL (ref 4.0–10.5)
WBC: 9.1 10*3/uL (ref 4.0–10.5)
nRBC: 0 % (ref 0.0–0.2)
nRBC: 0 % (ref 0.0–0.2)

## 2018-08-13 LAB — RAPID URINE DRUG SCREEN, HOSP PERFORMED
Amphetamines: NOT DETECTED
Barbiturates: NOT DETECTED
Benzodiazepines: NOT DETECTED
Cocaine: NOT DETECTED
Opiates: NOT DETECTED
Tetrahydrocannabinol: NOT DETECTED

## 2018-08-13 LAB — ETHANOL: Alcohol, Ethyl (B): 93 mg/dL — ABNORMAL HIGH (ref <10)

## 2018-08-13 LAB — ACETAMINOPHEN LEVEL: Acetaminophen (Tylenol), Serum: <10 ug/mL — ABNORMAL LOW (ref 10–30)

## 2018-08-13 LAB — SALICYLATE LEVEL: Salicylate Lvl: <7 mg/dL (ref 2.8–30.0)

## 2018-08-13 MED ORDER — RISPERIDONE 0.5 MG PO TABS: 0.5000 mg | ORAL_TABLET | Freq: Every day | ORAL | 0 refills | Status: AC

## 2018-08-13 MED ORDER — SERTRALINE HCL 50 MG PO TABS: 50.0000 mg | ORAL_TABLET | Freq: Every day | ORAL | 0 refills | Status: AC

## 2018-08-13 MED ORDER — SODIUM CHLORIDE 0.9% FLUSH: 3.0000 mL | Freq: Once | INTRAVENOUS | Status: DC

## 2018-08-13 MED ORDER — RISPERIDONE 0.5 MG PO TABS: 0.5000 mg | ORAL_TABLET | Freq: Every day | ORAL | Status: DC

## 2018-08-13 MED ORDER — TRAZODONE HCL 100 MG PO TABS: 100.0000 mg | ORAL_TABLET | Freq: Every day | ORAL | 0 refills | Status: AC

## 2018-08-13 MED ORDER — SERTRALINE HCL 50 MG PO TABS: 50.0000 mg | ORAL_TABLET | Freq: Every day | ORAL | Status: DC

## 2018-08-13 MED ORDER — LORAZEPAM 1 MG PO TABS: 1.0000 mg | ORAL_TABLET | Freq: Four times a day (QID) | ORAL | Status: DC | PRN

## 2018-08-13 MED ORDER — RISPERIDONE 0.5 MG PO TABS
0.5000 mg | ORAL_TABLET | Freq: Every day | ORAL | Status: DC
  Administered 2018-08-14 (×2): 0.5 mg via ORAL
  Filled 2018-08-13 (×2): qty 1

## 2018-08-13 MED ORDER — TRAZODONE HCL 50 MG PO TABS: 100.0000 mg | ORAL_TABLET | Freq: Every day | ORAL | Status: DC

## 2018-08-13 MED ORDER — TRAZODONE HCL 100 MG PO TABS
100.0000 mg | ORAL_TABLET | Freq: Every day | ORAL | Status: DC
  Administered 2018-08-14: 100 mg via ORAL
  Filled 2018-08-13 (×2): qty 1

## 2018-08-13 MED ORDER — SERTRALINE HCL 50 MG PO TABS
50.0000 mg | ORAL_TABLET | Freq: Every day | ORAL | Status: DC
  Administered 2018-08-14: 11:00:00 50 mg via ORAL
  Filled 2018-08-13: qty 1

## 2018-08-13 NOTE — ED Notes (Signed)
Dc instructions given to the pt. Pt asking about talking to the psych doctor, pt oriented that he had a psych assessment 10/11/2018 and the provider don't see any need for another assessment today, pt's cardiac enzymes and EKG WNL, no cp present on discharge time.

## 2018-08-13 NOTE — ED Triage Notes (Signed)
Patient is complaining of mid chest pain that is not radiating. Patient has used cocaine an hour ago. Patient states he has a hx of schizophrenia. Patient is homicidal and suicidal.

## 2018-08-13 NOTE — ED Notes (Signed)
Sent extra blue top to lab

## 2018-08-13 NOTE — ED Notes (Signed)
Patient transported to X-ray 

## 2018-08-13 NOTE — ED Notes (Signed)
Bed: WTR6 Expected date:  Expected time:  Means of arrival:  Comments: 

## 2018-08-13 NOTE — ED Notes (Signed)
Dc instructions and prescriptions given tot he pt and pt refuses to take them with him.

## 2018-08-13 NOTE — Discharge Instructions (Addendum)
You were seen last night for similar complaints and had normal labs, chest x-ray and EKG.  The gastric team had seen you and recommended outpatient treatment.  You are safe to be discharged today.

## 2018-08-13 NOTE — ED Triage Notes (Signed)
Pt arrived by EMS for chest pain and suicidal ideation. Was seen last night for same. Pt denies cocaine use tonight, has been drinking heavily today. Has plan to OD on pills for suicide attempt. Reports auditory hallucinations, has been out of schizophrenia meds

## 2018-08-13 NOTE — ED Provider Notes (Signed)
TIME SEEN: 3:48 AM  CHIEF COMPLAINT: Suicidal thoughts, chest pain  HPI: Patient is a 48 year old male with history of schizophrenia, hypertension, hyperlipidemia who presents to the emergency department with suicidal thoughts with plan to overdose.  States he lives in [place] and is coming home from [place].  States he started having chest pain sometime last night that he describes as an anterior tightness without radiation.  No associated shortness of breath, diaphoresis or dizziness, nausea or vomiting.  No history of CAD.  No chest pain currently.  No aggravating or alleviating factors.  States he did drink alcohol and smoke cigarettes last night.  He also uses cocaine but denies using it recently.  States he was previously on Zoloft, risperidone and trazodone and has been off for several weeks and would like to get these medications restarted.  ROS: See HPI Constitutional: no fever  Eyes: no drainage  ENT: no runny nose   Cardiovascular:   chest pain  Resp: no SOB  GI: no vomiting GU: no dysuria Integumentary: no rash  Allergy: no hives  Musculoskeletal: no leg swelling  Neurological: no slurred speech ROS otherwise negative  PAST MEDICAL HISTORY/PAST SURGICAL HISTORY:  Past Medical History:  Diagnosis Date  . Bipolar affective disorder ([place])   . Hypercholesteremia   . Hypertension   . MDD (major depressive disorder)   . Schizophrenia ([place])     MEDICATIONS:  Prior to Admission medications   Not on File    ALLERGIES:  No Known Allergies  SOCIAL HISTORY:  Social History   Tobacco Use  . Smoking status: Current Every Day Smoker    Packs/day: 0.50    Types: Cigarettes  . Smokeless tobacco: Never Used  Substance Use Topics  . Alcohol use: Yes    Comment: 6-12 pack a day    FAMILY HISTORY: No family history on file.  EXAM: BP 138/86 (BP Location: Right Arm)   Pulse 70   Temp 98.7 F (37.1 C) (Oral)   Resp 18   Ht 6' 5" (1.956 m)   Wt 113.4 kg   SpO2 97%    BMI 29.65 kg/m  CONSTITUTIONAL: Alert and oriented and responds appropriately to questions. Well-appearing; well-nourished HEAD: Normocephalic EYES: Conjunctivae clear, pupils appear equal, EOMI ENT: normal nose; moist mucous membranes NECK: Supple, no meningismus, no nuchal rigidity, no LAD  CARD: RRR; S1 and S2 appreciated; no murmurs, no clicks, no rubs, no gallops RESP: Normal chest excursion without splinting or tachypnea; breath sounds clear and equal bilaterally; no wheezes, no rhonchi, no rales, no hypoxia or respiratory distress, speaking full sentences ABD/GI: Normal bowel sounds; non-distended; soft, non-tender, no rebound, no guarding, no peritoneal signs, no hepatosplenomegaly BACK:  The back appears normal and is non-tender to palpation, there is no CVA tenderness EXT: Normal ROM in all joints; non-tender to palpation; no edema; normal capillary refill; no cyanosis, no calf tenderness or swelling    SKIN: Normal color for age and race; warm; no rash NEURO: Moves all extremities equally PSYCH: Patient has a flat affect.  He endorses suicidal thoughts with plan.   MEDICAL DECISION MAKING: Patient here with complaints of suicidal thoughts with plan.  He would like to be restarted on his medications.  They have been ordered here in the emergency department.  Does complain of chest pain that he describes as a tightness.  No associated symptoms.  Denies aggravating or alleviating factors.  Symptoms lasted 1 hour sometime last night and he is now asymptomatic.  Will obtain  an EKG.  It appears he was just in the emergency department last night for same symptoms and had 2 negative troponins and a clear chest x-ray.  I do not feel this needs to be repeated.  He denies any new chest pain.  He was seen by TTS and they recommended outpatient management.  They did not feel he met inpatient criteria.  I do not feel that anything has significantly changed in less than 12 hours where he would need a  repeat TTS consult.  I will give him a prescription for his medications and I feel he is safe to be discharged and follow-up with his doctors in [facility].  ED PROGRESS: Patient's EKG shows no acute abnormality.  I feel he is safe to be discharged home with outpatient resources.  He is clinically sober at this time.  Will re-prescribe his medications.  At this time, I do not feel there is any life-threatening condition present. I have reviewed and discussed all results (EKG, imaging, lab, urine as appropriate) and exam findings with patient/family. I have reviewed nursing notes and appropriate previous records.  I feel the patient is safe to be discharged home without further emergent workup and can continue workup as an outpatient as needed. Discussed usual and customary return precautions. Patient/family verbalize understanding and are comfortable with this plan.  Outpatient follow-up has been provided as needed. All questions have been answered.      EKG Interpretation  Date/Time:  Saturday August 13 2018 04:24:50 EDT Ventricular Rate:  68 PR Interval:  158 QRS Duration: 86 QT Interval:  412 QTC Calculation: 438 R Axis:   52 Text Interpretation:  Normal sinus rhythm Normal ECG No significant change since last tracing Confirmed by , Cyril Mourning [phone]) on 08/13/2018 4:29:13 AM         , Delice Bison, DO 08/13/18 0430

## 2018-08-14 ENCOUNTER — Inpatient Hospital Stay (HOSPITAL_COMMUNITY)
Admission: AD | Admit: 2018-08-14 | Discharge: 2018-08-15 | DRG: 885 | Disposition: A | Discharge status: Home / Self Care | Payer: Federal, State, Local not specified - Other | Source: Intra-hospital | Attending: Psychiatry | Admitting: Psychiatry

## 2018-08-14 DIAGNOSIS — F17210 Nicotine dependence, cigarettes, uncomplicated: Secondary | ICD-10-CM | POA: Diagnosis present

## 2018-08-14 DIAGNOSIS — Z590 Homelessness: Secondary | ICD-10-CM

## 2018-08-14 DIAGNOSIS — Z1159 Encounter for screening for other viral diseases: Secondary | ICD-10-CM

## 2018-08-14 DIAGNOSIS — I10 Essential (primary) hypertension: Secondary | ICD-10-CM | POA: Diagnosis present

## 2018-08-14 DIAGNOSIS — F250 Schizoaffective disorder, bipolar type: Principal | ICD-10-CM | POA: Diagnosis present

## 2018-08-14 DIAGNOSIS — Z765 Malingerer [conscious simulation]: Secondary | ICD-10-CM | POA: Diagnosis not present

## 2018-08-14 LAB — BASIC METABOLIC PANEL
Anion gap: 11 (ref 5–15)
BUN: 12 mg/dL (ref 6–20)
CO2: 23 mmol/L (ref 22–32)
Calcium: 9 mg/dL (ref 8.9–10.3)
Chloride: 106 mmol/L (ref 98–111)
Creatinine, Ser: 1.04 mg/dL (ref 0.61–1.24)
GFR calc Af Amer: >60 mL/min (ref >60)
GFR calc non Af Amer: >60 mL/min (ref >60)
Glucose, Bld: 106 mg/dL — ABNORMAL HIGH (ref 70–99)
Potassium: 3.7 mmol/L (ref 3.5–5.1)
Sodium: 140 mmol/L (ref 135–145)

## 2018-08-14 LAB — RAPID URINE DRUG SCREEN, HOSP PERFORMED
Amphetamines: NOT DETECTED
Barbiturates: NOT DETECTED
Benzodiazepines: NOT DETECTED
Cocaine: POSITIVE — AB
Opiates: NOT DETECTED
Tetrahydrocannabinol: NOT DETECTED

## 2018-08-14 LAB — ACETAMINOPHEN LEVEL: Acetaminophen (Tylenol), Serum: <10 ug/mL — ABNORMAL LOW (ref 10–30)

## 2018-08-14 LAB — ETHANOL: Alcohol, Ethyl (B): 124 mg/dL — ABNORMAL HIGH (ref <10)

## 2018-08-14 LAB — SALICYLATE LEVEL: Salicylate Lvl: <7 mg/dL (ref 2.8–30.0)

## 2018-08-14 LAB — SARS CORONAVIRUS 2 BY RT PCR (HOSPITAL ORDER, PERFORMED IN ~~LOC~~ HOSPITAL LAB): SARS Coronavirus 2: NEGATIVE

## 2018-08-14 NOTE — ED Provider Notes (Signed)
[facility] DEPT Provider Note   CSN: [id number] Arrival date & time: 08/13/18  2306     History   Chief Complaint Chief Complaint  Patient presents with  . Chest Pain  . Suicidal  . Homicidal    HPI Craig Hogan is a 48 y.o. male.     The history is provided by the patient.  Mental Health Problem Presenting symptoms: homicidal ideas and suicidal thoughts   Degree of incapacity (severity):  Severe Onset quality:  Sudden Timing:  Constant Progression:  Worsening Chronicity:  New Context: alcohol use and drug abuse   Relieved by:  Nothing Worsened by:  Nothing Associated symptoms: chest pain   Patient history of alcohol abuse, subs abuse, depression presents with suicidal/homicidal ideation.  He reports he plans to overdose on over-the-counter drugs.  He reports he has homicidal ideation to harm a group of people.  He also reports consistent chest pain with shortness of breath.  Similar to prior episodes.  Past Medical History:  Diagnosis Date  . Bipolar affective disorder ([place])   . Hypercholesteremia   . Hypertension   . MDD (major depressive disorder)   . Schizophrenia [facility])     Patient Active Problem List   Diagnosis Date Noted  . Malingering 08/12/2018  . Cocaine abuse with cocaine-induced mood disorder ([place]) 06/06/2017    Past Surgical History:  Procedure Laterality Date  . NO PAST SURGERIES          Home Medications    Prior to Admission medications   Medication Sig Start Date End Date Taking? Authorizing Provider  risperiDONE (RISPERDAL) 0.5 MG tablet Take 1 tablet (0.5 mg total) by mouth at bedtime. 08/13/18   Ward, Delice Bison, DO  sertraline (ZOLOFT) 50 MG tablet Take 1 tablet (50 mg total) by mouth daily. 08/13/18   Ward, Delice Bison, DO  traZODone (DESYREL) 100 MG tablet Take 1 tablet (100 mg total) by mouth at bedtime. 08/13/18   Ward, Delice Bison, DO    Family History History reviewed. No pertinent family  history.  Social History Social History   Tobacco Use  . Smoking status: Current Every Day Smoker    Packs/day: 0.50    Types: Cigarettes  . Smokeless tobacco: Never Used  Substance Use Topics  . Alcohol use: Yes    Comment: 6-12 pack a day  . Drug use: Yes    Types: Cocaine     Allergies   Patient has no known allergies.   Review of Systems Review of Systems  Constitutional: Negative for fever.  Respiratory: Positive for shortness of breath.   Cardiovascular: Positive for chest pain.  Psychiatric/Behavioral: Positive for homicidal ideas and suicidal ideas.  All other systems reviewed and are negative.    Physical Exam Updated Vital Signs BP (!) 158/107 (BP Location: Left Arm)   Pulse 83   Temp 99.2 F (37.3 C) (Oral)   Resp 16   Ht 1.956 m (6\' 5" )   Wt 113.4 kg   SpO2 97%   BMI 29.65 kg/m   Physical Exam  CONSTITUTIONAL: Well developed/well nourished, well dressed HEAD: Normocephalic/atraumatic EYES: Wearing sunglasses ENMT: Mucous membranes moist NECK: supple no meningeal signs SPINE/BACK:entire spine nontender CV: S1/S2 noted, no murmurs/rubs/gallops noted LUNGS: Lungs are clear to auscultation bilaterally, no apparent distress ABDOMEN: soft, nontender NEURO: Pt is awake/alert/appropriate, moves all extremitiesx4.  No facial droop.   EXTREMITIES: pulses normal/equal, full ROM SKIN: warm, color normal PSYCH: no abnormalities of mood noted, alert and oriented  to situation  ED Treatments / Results  Labs (all labs ordered are listed, but only abnormal results are displayed) Labs Reviewed  BASIC METABOLIC PANEL - Abnormal; Notable for the following components:      Result Value   Glucose, Bld 106 (*)    All other components within normal limits  ETHANOL - Abnormal; Notable for the following components:   Alcohol, Ethyl (B) 124 (*)    All other components within normal limits  ACETAMINOPHEN LEVEL - Abnormal; Notable for the following components:    Acetaminophen (Tylenol), Serum <10 (*)    All other components within normal limits  RAPID URINE DRUG SCREEN, HOSP PERFORMED - Abnormal; Notable for the following components:   Cocaine POSITIVE (*)    All other components within normal limits  CBC  SALICYLATE LEVEL    EKG EKG Interpretation  Date/Time:  Saturday August 13 2018 23:15:18 EDT Ventricular Rate:  82 PR Interval:    QRS Duration: 114 QT Interval:  384 QTC Calculation: 449 R Axis:   3 Text Interpretation:  Sinus rhythm Incomplete right bundle branch block similar to 08/12/2018 Confirmed by Zadie RhineWickline, Tay Whitwell ([id number]) on 08/13/2018 11:30:10 PM   Radiology Dg Chest 2 View  Result Date: 08/13/2018 CLINICAL DATA:  Chest pain.  Calcaneus. EXAM: CHEST - 2 VIEW COMPARISON:  05/12/2018 FINDINGS: The heart size and mediastinal contours are within normal limits. Both lungs are clear. The visualized skeletal structures are unremarkable. IMPRESSION: No active cardiopulmonary disease. Electronically Signed   By: Katherine Mantlehristopher  Green M.D.   On: 08/13/2018 23:31   Dg Chest Portable 1 View  Result Date: 08/12/2018 CLINICAL DATA:  Chest pain x2 hours. EXAM: PORTABLE CHEST 1 VIEW COMPARISON:  None. FINDINGS: The heart size and mediastinal contours are within normal limits. Both lungs are clear. The visualized skeletal structures are unremarkable. IMPRESSION: No active disease. Electronically Signed   By: Katherine Mantlehristopher  Green M.D.   On: 08/12/2018 01:04    Procedures Procedures   Medications Ordered in ED Medications  sodium chloride flush (NS) 0.9 % injection 3 mL (has no administration in time range)  risperiDONE (RISPERDAL) tablet 0.5 mg (has no administration in time range)  sertraline (ZOLOFT) tablet 50 mg (has no administration in time range)  traZODone (DESYREL) tablet 100 mg (has no administration in time range)  LORazepam (ATIVAN) tablet 1 mg (has no administration in time range)     Initial Impression / Assessment and Plan / ED  Course  I have reviewed the triage vital signs and the nursing notes.  Pertinent labs & imaging results that were available during my care of the patient were reviewed by me and considered in my medical decision making (see chart for details).       12:18 AM Patient presents for third ER visit the past several days.  He recently relocated here from out of town.  He is currently homeless. He reports he is having suicidal ideation by overdose, reports wanting to harm people. He is abusing alcohol and cocaine. He also reports chest pain, but no acute EKG changes.  Chest x-ray is negative. He appears comfortable in no acute distress.  Will defer further cardiac work-up.  Low suspicion for ACS/PE/Dissection (pt resting comfortably, main concern is SI/HI/drug abuse)  Due to multiple ER visits/discharges, I will re-consult psych for consideration for admission to behavioral health  Pt also requesting social work consultation  Patient is otherwise medically stable this time.  Home medications have been ordered.  Final Clinical Impressions(s) /  ED Diagnoses   Final diagnoses:  Homicidal ideation  Suicidal ideation  Alcohol abuse  Cocaine abuse [facility](HCC)    ED Discharge Orders    None       Zadie RhineWickline, Ama Mcmaster, MD 08/14/18 [phone]

## 2018-08-14 NOTE — ED Notes (Addendum)
Received Craig Hogan this PM in his room awake in bed. He was given a snack and continued to watch TV. He was kept informed on his transfer and was transferred at 2250 hrs by [facility] transporter without incident.

## 2018-08-14 NOTE — BH Assessment (Addendum)
Tele Assessment Note   Patient Name: Craig Hogan MRN: [id number] Referring Physician:  Location of Patient: [facility] ED Location of Provider: [place]  Craig Hogan is an 48 y.o. male.  The pt came in due to [place].  He denies having a plan and denies making any attempts in the past.  This is the pt's third visit in 48 hours.  He stated he is stressed about people following him with a guns.  When asked if this was reality or an hallucination.  The pt stated he didn't know and thought he could be a mixture of both. The pt was last inpatient about 3 weeks ago.  He denies seeing a counselor or psychiatrist in the past.  The pt is currently homeless.  He stated he was moving from [place] to [place] and stopped in [place].  He isn't sure if he will still move to [place].  The pt denies self harm, HI, legal issues, and history of abuse.  He stated he hears voices "telling him what people think".  He stated he is sleeping and eating well.  The pt has been drinking an 18 pack of beer and using cocaine daily.  The pt last used prior to coming to the hospital. His UDS is positive for cocaine and his alcohol level was 124 when he arrived in the emergency room.  Pt is dressed in scrubs. He is drowsy and oriented x4. Pt speaks in a clear tone, at a low volume and slow pace. Eye contact is poor. Pt's mood is depressed. Thought process is coherent and relevant. There is no indication Pt is currently responding to internal stimuli or experiencing delusional thought content.?Pt was cooperative throughout assessment.    Diagnosis: F25.1 Schizoaffective disorder, Depressive type F10.20 Alcohol use disorder, Severe F14.20 Cocaine use disorder, Severe  Past Medical History:  Past Medical History:  Diagnosis Date  . Bipolar affective disorder ([place])   . Hypercholesteremia   . Hypertension   . MDD (major depressive disorder)   . Schizophrenia [facility])     Past Surgical History:   Procedure Laterality Date  . NO PAST SURGERIES      Family History: History reviewed. No pertinent family history.  Social History:  reports that he has been smoking cigarettes. He has been smoking about 0.50 packs per day. He has never used smokeless tobacco. He reports current alcohol use. He reports current drug use. Drug: Cocaine.  Additional Social History:  Alcohol / Drug Use Pain Medications: See MAR Prescriptions: See MAR Over the Counter: See MAR History of alcohol / drug use?: Yes Longest period of sobriety (when/how long): NA Substance #1 Name of Substance 1: alcohol 1 - Age of First Use: 14 1 - Amount (size/oz): 18 pack of beer 1 - Frequency: daily 1 - Last Use / Amount: 08/13/2018 Substance #2 Name of Substance 2: cocaine 2 - Age of First Use: 18 2 - Amount (size/oz): 30 dollars worth 2 - Frequency: daily 2 - Last Use / Amount: 08/13/2018  CIWA: CIWA-Ar BP: (!) 172/94 Pulse Rate: 72 COWS:    Allergies: No Known Allergies  Home Medications: (Not in a hospital admission)   OB/GYN Status:  No LMP for male patient.  General Assessment Data [facility] of [facility]: [facility] TTS Assessment: In system Is this a Tele or Face-to-Face Assessment?: Tele Assessment Is this an Initial Assessment or a Re-assessment for this encounter?: Initial Assessment Patient Accompanied by:: N/A Language Other than English: No Living Arrangements: Homeless/Shelter What  gender do you identify as?: Male Marital status: Single Pregnancy Status: No Living Arrangements: Other (Comment)(homeless) Can pt return to current living arrangement?: Yes Admission Status: Voluntary Is patient capable of signing voluntary admission?: Yes Referral Source: Self/Family/Friend Insurance type: Self Pay     Crisis Care Plan Living Arrangements: Other (Comment)(homeless) Legal Guardian: Other:(Self) Name of Psychiatrist: None Name of Therapist: None  Education Status Is patient currently in  school?: No Is the patient employed, unemployed or receiving disability?: Unemployed  Risk to self with the past 6 months Suicidal Ideation: Yes-Currently Present Has patient been a risk to self within the past 6 months prior to admission? : No Suicidal Intent: No Has patient had any suicidal intent within the past 6 months prior to admission? : No Is patient at risk for suicide?: Yes Suicidal Plan?: No Has patient had any suicidal plan within the past 6 months prior to admission? : No Specify Current Suicidal Plan: no plan Access to Means: No What has been your use of drugs/alcohol within the last 12 months?: cocaine and alcohol Previous Attempts/Gestures: No How many times?: 0 Other Self Harm Risks: none Triggers for Past Attempts: None known Intentional Self Injurious Behavior: None Family Suicide History: No Recent stressful life event(s): Turmoil (Comment)(homeless and hallucinations) Persecutory voices/beliefs?: Yes Depression: Yes Depression Symptoms: Despondent Substance abuse history and/or treatment for substance abuse?: Yes Suicide prevention information given to non-admitted patients: Yes  Risk to Others within the past 6 months Homicidal Ideation: No Does patient have any lifetime risk of violence toward others beyond the six months prior to admission? : No Thoughts of Harm to Others: No Current Homicidal Intent: No Current Homicidal Plan: No Access to Homicidal Means: No Identified Victim: pt denies History of harm to others?: No Assessment of Violence: None Noted Violent Behavior Description: none Does patient have access to weapons?: No Criminal Charges Pending?: No Does patient have a court date: No Is patient on probation?: No  Psychosis Hallucinations: Auditory, Visual Delusions: None noted  Mental Status Report Appearance/Hygiene: Unremarkable Eye Contact: Poor Motor Activity: Freedom of movement, Unremarkable Speech: Logical/coherent, Slow,  Soft Level of Consciousness: Drowsy Mood: Depressed Affect: Depressed Anxiety Level: None Thought Processes: Coherent, Relevant Judgement: Impaired Orientation: Person, Place, Time, Situation Obsessive Compulsive Thoughts/Behaviors: None  Cognitive Functioning Concentration: Normal Memory: Recent Intact, Remote Intact Is patient IDD: No Insight: Poor Impulse Control: Poor Appetite: Good Have you had any weight changes? : No Change Sleep: No Change Total Hours of Sleep: 8 Vegetative Symptoms: None  ADLScreening [facility] Assessment Services) Patient's cognitive ability adequate to safely complete daily activities?: Yes Patient able to express need for assistance with ADLs?: Yes Independently performs ADLs?: Yes (appropriate for developmental age)  Prior Inpatient Therapy Prior Inpatient Therapy: Yes Prior Therapy Dates: 3 weeks ago [facility] Reason for Treatment: Anxiety, depression  Prior Outpatient Therapy Prior Outpatient Therapy: No Does patient have an ACCT team?: No Does patient have Intensive In-House Services?  : No Does patient have [facility] services? : No Does patient have P4CC services?: No  ADL Screening (condition at time of admission) Patient's cognitive ability adequate to safely complete daily activities?: Yes Patient able to express need for assistance with ADLs?: Yes Independently performs ADLs?: Yes (appropriate for developmental age)       Abuse/Neglect Assessment (Assessment to be complete while patient is alone) Abuse/Neglect Assessment Can Be Completed: Yes Physical Abuse: Denies Verbal Abuse: Denies Sexual Abuse: Denies Exploitation of patient/patient's resources: Denies Values / Beliefs  Cultural Requests During Hospitalization: None Spiritual Requests During Hospitalization: None Consults Spiritual Care Consult Needed: No Social Work Consult Needed: No Merchant navy officerAdvance Directives (For  Healthcare) Does Patient Have a Medical Advance Directive?: No Would patient like information on creating a medical advance directive?: No - Patient declined          Disposition:  Disposition Initial Assessment Completed for this Encounter: Yes PA Nira ConnJason Berry recommends the pt be observed overnight for safety and stabilization.  The pt is to be reassessed by psychiatry in the morning.   This service was provided via telemedicine using a 2-way, interactive audio and video technology.  Names of all persons participating in this telemedicine service and their role in this encounter. Name: Dyanne IhaD'Angelo Markowicz Role: Pt  Name:  Role:   Name:  Role:   Name:  Role:     Ottis StainGarvin, Naarah Borgerding Jermaine 08/14/2018 5:53 AM

## 2018-08-15 ENCOUNTER — Encounter (HOSPITAL_COMMUNITY): Payer: Self-pay

## 2018-08-15 ENCOUNTER — Other Ambulatory Visit: Payer: Self-pay

## 2018-08-15 DIAGNOSIS — F250 Schizoaffective disorder, bipolar type: Secondary | ICD-10-CM | POA: Diagnosis not present

## 2018-08-15 LAB — TSH: TSH: 2.383 u[IU]/mL (ref 0.350–4.500)

## 2018-08-15 LAB — LIPID PANEL
Cholesterol: 255 mg/dL — ABNORMAL HIGH (ref 0–200)
HDL: 49 mg/dL (ref >40)
LDL Cholesterol: 167 mg/dL — ABNORMAL HIGH (ref 0–99)
Total CHOL/HDL Ratio: 5.2 RATIO
Triglycerides: 196 mg/dL — ABNORMAL HIGH (ref <150)
VLDL: 39 mg/dL (ref 0–40)

## 2018-08-15 LAB — HEMOGLOBIN A1C
Hgb A1c MFr Bld: 5.5 % (ref 4.8–5.6)
Mean Plasma Glucose: 111.15 mg/dL

## 2018-08-15 MED ORDER — ALUM & MAG HYDROXIDE-SIMETH 200-200-20 MG/5ML PO SUSP: 30.0000 mL | ORAL | Status: DC | PRN

## 2018-08-15 MED ORDER — AMLODIPINE BESYLATE 10 MG PO TABS
10.0000 mg | ORAL_TABLET | Freq: Every day | ORAL | Status: DC
  Filled 2018-08-15 (×2): qty 7

## 2018-08-15 MED ORDER — TRAZODONE HCL 100 MG PO TABS
100.0000 mg | ORAL_TABLET | Freq: Every day | ORAL | Status: DC
  Administered 2018-08-15: 100 mg via ORAL
  Filled 2018-08-15: qty 7
  Filled 2018-08-15 (×2): qty 1

## 2018-08-15 MED ORDER — RISPERIDONE 0.5 MG PO TABS
0.5000 mg | ORAL_TABLET | Freq: Every day | ORAL | Status: DC
  Filled 2018-08-15: qty 1
  Filled 2018-08-15: qty 7

## 2018-08-15 MED ORDER — SERTRALINE HCL 50 MG PO TABS
50.0000 mg | ORAL_TABLET | Freq: Every day | ORAL | Status: DC
  Administered 2018-08-15: 50 mg via ORAL
  Filled 2018-08-15 (×2): qty 1
  Filled 2018-08-15: qty 7

## 2018-08-15 MED ORDER — ACETAMINOPHEN 325 MG PO TABS: 650.0000 mg | ORAL_TABLET | Freq: Four times a day (QID) | ORAL | Status: DC | PRN

## 2018-08-15 MED ORDER — LORAZEPAM 1 MG PO TABS: 1.0000 mg | ORAL_TABLET | Freq: Four times a day (QID) | ORAL | Status: DC | PRN

## 2018-08-15 MED ORDER — AMLODIPINE BESYLATE 10 MG PO TABS: 10.0000 mg | ORAL_TABLET | Freq: Every day | ORAL | 11 refills | Status: AC

## 2018-08-15 MED ORDER — MAGNESIUM HYDROXIDE 400 MG/5ML PO SUSP: 30.0000 mL | Freq: Every day | ORAL | Status: DC | PRN

## 2018-08-15 NOTE — [facility] Suicide Risk Assessment (Signed)
[facility] INPATIENT:  Family/Significant Other Suicide Prevention Education  Suicide Prevention Education:  Patient Refusal for Family/Significant Other Suicide Prevention Education: The patient Craig Hogan has refused to provide written consent for family/significant other to be provided Family/Significant Other Suicide Prevention Education during admission and/or prior to discharge.  Physician notified.  Trecia Rogers 08/15/2018, 9:20 AM

## 2018-08-15 NOTE — Progress Notes (Signed)
Pt discharged to lobby. Pt was stable and appreciative at that time. All papers, samples and prescriptions were given and valuables returned. Verbal understanding expressed. Denies SI/HI and A/VH. Pt given opportunity to express concerns and ask questions.  

## 2018-08-15 NOTE — Progress Notes (Signed)
Recreation Therapy Notes  Date: 6.29.20 Time: 1000 Location: 500 Hall Dayroom  Group Topic: Anxiety  Goal Area(s) Addresses:  Patient will identify triggers for anxiety. Patient will identify physical symptoms when anxious. Patient will identify coping strategies for anxiety.  Intervention: Worksheet  Activity:  Introduction to Anxiety.  Patients will identify things that trigger anxiety, physical symptoms to anxiety, thoughts that occur when anxious and ways to cope with anxiety.  Education: Communication, Discharge Planning  Education Outcome: Acknowledges understanding/In group clarification offered/Needs additional education.   Clinical Observations/Feedback: Pt did not attend group.    Victorino Sparrow, LRT/CTRS         Ria Comment, Loy Little A 08/15/2018 11:13 AM

## 2018-08-15 NOTE — Discharge Summary (Signed)
Physician Discharge Summary Note  Patient:  Craig Hogan is an 48 y.o., male MRN:  [id number] DOB:  02-Jan-1971 Patient phone:  [phone] (home)  Patient address:   [address] [place] [address],  Total Time spent with patient: 45 minutes  Date of Admission:  08/14/2018 Date of Discharge: 08/15/2018  Reason for Admission:    Craig Hogan is 48 years of age and known to this healthcare system due to prior encounters he is a homeless individual who acknowledges that he reports hallucinations and suicidality in order to obtain housing on an intermittent basis and apparently was hospitalized within the last 2 weeks but was guarded about that information.  Was admitted for observation, drug screen was negative, monitor through the night and displayed no dangerous behaviors.  By the morning of the 29th he requested discharge because he stated he had made arrangements to catch a plane to get out of [place].  He insisted he had no thoughts of harming self or others no cravings tremors or withdrawal and again was hoping to talk to social worker just to get transportation to the airport.  The only medication adjustment was addition of Norvasc due to hypertension noted here  Principal Problem: Malingering due to homelessness Discharge Diagnoses: Active Problems:   Schizoaffective disorder, manic type [facility])   Past Psychiatric History: ext  Past Medical History:  Past Medical History:  Diagnosis Date  . Bipolar affective disorder ([place])   . Hypercholesteremia   . Hypertension   . MDD (major depressive disorder)   . Schizophrenia [facility])     Past Surgical History:  Procedure Laterality Date  . NO PAST SURGERIES     Family History: History reviewed. No pertinent family history. Family Psychiatric  History: neg Social History:  Social History   Substance and Sexual Activity  Alcohol Use Yes  . Alcohol/week: 18.0 standard drinks  . Types: 18 Cans of beer per week   Comment: 12- 18 pk daily     Social History   Substance and Sexual Activity  Drug Use Yes  . Frequency: 4.0 times per week  . Types: Cocaine   Comment: 4 days per week last use $30 6/27    Social History   Socioeconomic History  . Marital status: Single    Spouse name: Not on file  . Number of children: Not on file  . Years of education: Not on file  . Highest education level: Not on file  Occupational History  . Not on file  Social Needs  . Financial resource strain: Patient refused  . Food insecurity    Worry: Patient refused    Inability: Patient refused  . Transportation needs    Medical: Patient refused    Non-medical: Patient refused  Tobacco Use  . Smoking status: Current Every Day Smoker    Packs/day: 0.50    Types: Cigarettes  . Smokeless tobacco: Never Used  Substance and Sexual Activity  . Alcohol use: Yes    Alcohol/week: 18.0 standard drinks    Types: 18 Cans of beer per week    Comment: 12- 18 pk daily  . Drug use: Yes    Frequency: 4.0 times per week    Types: Cocaine    Comment: 4 days per week last use $30 6/27  . Sexual activity: Yes    Birth control/protection: Condom  Lifestyle  . Physical activity    Days per week: Patient refused    Minutes per session: Patient refused  . Stress: Not  on file  Relationships  . Social Musicianconnections    Talks on phone: Patient refused    Gets together: Patient refused    Attends religious service: Patient refused    Active member of club or organization: Patient refused    Attends meetings of clubs or organizations: Patient refused    Relationship status: Patient refused  Other Topics Concern  . Not on file  Social History Narrative  . Not on file    Hospital Course: As above patient admitted for observation displayed no danger behaviors and requested discharge acknowledging malingering for housing purposes Musculoskeletal: Strength & Muscle Tone: within normal limits Gait & Station: normal Patient  leans: N/A  Psychiatric Specialty Exam: ROS  Blood pressure (!) 142/89, pulse 80, temperature 99.3 F (37.4 C), temperature source Oral, resp. rate 16, height 6\' 5"  (1.956 m), weight 114.3 kg.Body mass index is 29.88 kg/m.  General Appearance: Casual and Disheveled  Eye Contact:: good  Speech:  Clear and Coherent409  Volume:  Normal  Mood:  Euthymic  Affect:  Congruent  Thought Process:  Coherent  Orientation:  Full (Time, Place, and Person)  Thought Content:  Logical  Suicidal Thoughts:  No  Homicidal Thoughts:  No  Memory:  Immediate;   Good  Judgement:  Good  Insight:  Good  Psychomotor Activity:  Normal  Concentration:  Good  Recall:  Good  Fund of Knowledge:Good  Language: Fair  Akathisia:  Negative  Handed:  Right  AIMS (if indicated):     Assets:  Communication Skills Physical Health Resilience  Sleep:  Number of Hours: 5  Cognition: WNL  ADL's:  Intact       Have you used any form of tobacco in the last 30 days? (Cigarettes, Smokeless Tobacco, Cigars, and/or Pipes): Yes  Has this patient used any form of tobacco in the last 30 days? (Cigarettes, Smokeless Tobacco, Cigars, and/or Pipes) Yes, No  Blood Alcohol level:  Lab Results  Component Value Date   ETH 124 (H) 08/13/2018   ETH 93 (H) 08/13/2018    Metabolic Disorder Labs:  No results found for: HGBA1C, MPG No results found for: PROLACTIN No results found for: CHOL, TRIG, HDL, CHOLHDL, VLDL, LDLCALC  See Psychiatric Specialty Exam and Suicide Risk Assessment completed by Attending Physician prior to discharge.  Discharge destination:  Home  Is patient on multiple antipsychotic therapies at discharge:  No   Has Patient had three or more failed trials of antipsychotic monotherapy by history:  No  Recommended Plan for Multiple Antipsychotic Therapies: NA   Allergies as of 08/15/2018   No Known Allergies     Medication List    TAKE these medications     Indication  amLODipine 10 MG  tablet Commonly known as: NORVASC Take 1 tablet (10 mg total) by mouth daily.  Indication: High Blood Pressure Disorder   risperiDONE 0.5 MG tablet Commonly known as: RisperDAL Take 1 tablet (0.5 mg total) by mouth at bedtime.  Indication: Delusions   sertraline 50 MG tablet Commonly known as: ZOLOFT Take 1 tablet (50 mg total) by mouth daily.  Indication: Major Depressive Disorder   traZODone 100 MG tablet Commonly known as: DESYREL Take 1 tablet (100 mg total) by mouth at bedtime.  Indication: Trouble Sleeping        Signed: Malvin JohnsFARAH,Brooklynne Pereida, MD 08/15/2018, 7:36 AM

## 2018-08-15 NOTE — Progress Notes (Signed)
Patient ID: Craig Hogan, male   DOB: 1970/09/29, 49 y.o.   MRN: [id number]   CSW scheduled Calvin ride for the pt from [facility] to [facility] in [place], [place]. Ride is confirmed for 12:30pm.

## 2018-08-15 NOTE — Tx Team (Signed)
Initial Treatment Plan 08/15/2018 12:46 AM Craig Hogan BTY:[id number]    PATIENT STRESSORS: Financial difficulties Substance abuse   PATIENT STRENGTHS: Ability for insight Average or above average intelligence Motivation for treatment/growth   PATIENT IDENTIFIED PROBLEMS: Substance abuse  Depression  SI      " I want to get back on my medications"           DISCHARGE CRITERIA:  Improved stabilization in mood, thinking, and/or behavior  PRELIMINARY DISCHARGE PLAN: Placement in alternative living arrangements  PATIENT/FAMILY INVOLVEMENT: This treatment plan has been presented to and reviewed with the patient, Craig Hogan, and/or family member.  The patient and family have been given the opportunity to ask questions and make suggestions.  Aurora Mask, RN 08/15/2018, 12:46 AM

## 2018-08-15 NOTE — Tx Team (Signed)
Interdisciplinary Treatment and Diagnostic Plan Update  08/15/2018 Time of Session: 09:04am Craig IhaD'Angelo Rought MRN: [id number]  Principal Diagnosis: <principal problem not specified>  Secondary Diagnoses: Active Problems:   Schizoaffective disorder, manic type (HCC)   Current Medications:  Current Facility-Administered Medications  Medication Dose Route Frequency Provider Last Rate Last Dose  . acetaminophen (TYLENOL) tablet 650 mg  650 mg Oral Q6H PRN Maryagnes AmosStarkes-Perry, Takia S, FNP      . alum & mag hydroxide-simeth (MAALOX/MYLANTA) 200-200-20 MG/5ML suspension 30 mL  30 mL Oral Q4H PRN Rosario AdieStarkes-Perry, Juel Burrowakia S, FNP      . LORazepam (ATIVAN) tablet 1 mg  1 mg Oral Q6H PRN Starkes-Perry, Juel Burrowakia S, FNP      . magnesium hydroxide (MILK OF MAGNESIA) suspension 30 mL  30 mL Oral Daily PRN Rosario AdieStarkes-Perry, Juel Burrowakia S, FNP      . risperiDONE (RISPERDAL) tablet 0.5 mg  0.5 mg Oral QHS Starkes-Perry, Juel Burrowakia S, FNP      . sertraline (ZOLOFT) tablet 50 mg  50 mg Oral Daily Maryagnes AmosStarkes-Perry, Takia S, FNP   50 mg at 08/15/18 0814  . traZODone (DESYREL) tablet 100 mg  100 mg Oral QHS Maryagnes AmosStarkes-Perry, Takia S, FNP   100 mg at 08/15/18 0007   PTA Medications: Medications Prior to Admission  Medication Sig Dispense Refill Last Dose  . risperiDONE (RISPERDAL) 0.5 MG tablet Take 1 tablet (0.5 mg total) by mouth at bedtime. 30 tablet 0 08/15/2018 at Unknown time  . sertraline (ZOLOFT) 50 MG tablet Take 1 tablet (50 mg total) by mouth daily. 30 tablet 0   . traZODone (DESYREL) 100 MG tablet Take 1 tablet (100 mg total) by mouth at bedtime. 30 tablet 0     Patient Stressors: Financial difficulties Substance abuse  Patient Strengths: Ability for insight Average or above average intelligence Motivation for treatment/growth  Treatment Modalities: Medication Management, Group therapy, Case management,  1 to 1 session with clinician, Psychoeducation, Recreational therapy.   Physician Treatment Plan for Primary  Diagnosis: <principal problem not specified> Long Term Goal(s): Improvement in symptoms so as ready for discharge Improvement in symptoms so as ready for discharge   Short Term Goals: Ability to demonstrate self-control will improve Ability to identify and develop effective coping behaviors will improve Ability to identify and develop effective coping behaviors will improve Ability to maintain clinical measurements within normal limits will improve  Medication Management: Evaluate patient's response, side effects, and tolerance of medication regimen.  Therapeutic Interventions: 1 to 1 sessions, Unit Group sessions and Medication administration.  Evaluation of Outcomes: Adequate for Discharge  Physician Treatment Plan for Secondary Diagnosis: Active Problems:   Schizoaffective disorder, manic type (HCC)  Long Term Goal(s): Improvement in symptoms so as ready for discharge Improvement in symptoms so as ready for discharge   Short Term Goals: Ability to demonstrate self-control will improve Ability to identify and develop effective coping behaviors will improve Ability to identify and develop effective coping behaviors will improve Ability to maintain clinical measurements within normal limits will improve     Medication Management: Evaluate patient's response, side effects, and tolerance of medication regimen.  Therapeutic Interventions: 1 to 1 sessions, Unit Group sessions and Medication administration.  Evaluation of Outcomes: Adequate for Discharge   RN Treatment Plan for Primary Diagnosis: <principal problem not specified> Long Term Goal(s): Knowledge of disease and therapeutic regimen to maintain health will improve  Short Term Goals: Ability to participate in decision making will improve, Ability to verbalize feelings will improve, Ability to disclose and  discuss suicidal ideas, Ability to identify and develop effective coping behaviors will improve and Compliance with  prescribed medications will improve  Medication Management: RN will administer medications as ordered by provider, will assess and evaluate patient's response and provide education to patient for prescribed medication. RN will report any adverse and/or side effects to prescribing provider.  Therapeutic Interventions: 1 on 1 counseling sessions, Psychoeducation, Medication administration, Evaluate responses to treatment, Monitor vital signs and CBGs as ordered, Perform/monitor CIWA, COWS, AIMS and Fall Risk screenings as ordered, Perform wound care treatments as ordered.  Evaluation of Outcomes: Adequate for Discharge   LCSW Treatment Plan for Primary Diagnosis: <principal problem not specified> Long Term Goal(s): Safe transition to appropriate next level of care at discharge, Engage patient in therapeutic group addressing interpersonal concerns.  Short Term Goals: Engage patient in aftercare planning with referrals and resources and Increase skills for wellness and recovery  Therapeutic Interventions: Assess for all discharge needs, 1 to 1 time with Social worker, Explore available resources and support systems, Assess for adequacy in community support network, Educate family and significant other(s) on suicide prevention, Complete Psychosocial Assessment, Interpersonal group therapy.  Evaluation of Outcomes: Adequate for Discharge   Progress in Treatment: Attending groups: No. Participating in groups: No. Taking medication as prescribed: Yes. Toleration medication: Yes. Family/Significant other contact made: Yes, individual(s) contacted:  pt denied; with pt Patient understands diagnosis: Yes. Discussing patient identified problems/goals with staff: Yes. Medical problems stabilized or resolved: Yes. Denies suicidal/homicidal ideation: Yes. Issues/concerns per patient self-inventory: No. Other:   New problem(s) identified: No, Describe:  None  New Short Term/Long Term Goal(s):  Medication stabilization, elimination of SI thoughts, and development of a comprehensive mental wellness plan.   Patient Goals:    Discharge Plan or Barriers: Pt is discharging today  Reason for Continuation of Hospitalization: Pt is discharging today  Estimated Length of Stay: Pt is discharging today  Attendees: Patient: 08/15/2018  Physician: Dr. Johnn Hai, MD  08/15/2018  Nursing: Benjamine Mola, RN  08/15/2018  RN Care Manager: 08/15/2018   Social Worker: Ardelle Anton, [place] 08/15/2018   Recreational Therapist:  08/15/2018  Other:  08/15/2018   Other:  08/15/2018   Other: 08/15/2018     Scribe for Treatment Team: Trecia Rogers, LCSW 08/15/2018 9:20 AM

## 2018-08-15 NOTE — Progress Notes (Signed)
Patient is a 48 year old male admitted voluntarily for depression and SI to overdose on medication. He reports that he is currently homeless. He has medical hx of HTN, high cholesterol, bipolar d/o, schizophrenia and MDD. Patient reports alcohol use of 12-18 beers daily and cocaine use 3/4 times a week. He was cooperative and pleasant during admission, meal received and oriented to unit. Safety maintained with 15 min checks.

## 2018-08-15 NOTE — BHH Counselor (Signed)
Patient is discharging within 24 hours of admission to [facility]. CSW was not able to complete PSA due to patient discharging within 24 hours of admission. CSW did obtain aftercare follow up.

## 2018-08-15 NOTE — BHH Suicide Risk Assessment (Signed)
[facility] Discharge Suicide Risk Assessment   Principal Problem: At the point of discharge acknowledges malingering Discharge Diagnoses: Active Problems:   Schizoaffective disorder, manic type ([place])   Total Time spent with patient: 45 minutes  Musculoskeletal: Strength & Muscle Tone: within normal limits Gait & Station: normal Patient leans: N/A  Psychiatric Specialty Exam: ROS  Blood pressure (!) 142/89, pulse 80, temperature 99.3 F (37.4 C), temperature source Oral, resp. rate 16, height 6\' 5"  (1.956 m), weight 114.3 kg.Body mass index is 29.88 kg/m.  General Appearance: Casual and Disheveled  Eye Contact:: good  Speech:  Clear and Coherent409  Volume:  Normal  Mood:  Euthymic  Affect:  Congruent  Thought Process:  Coherent  Orientation:  Full (Time, Place, and Person)  Thought Content:  Logical  Suicidal Thoughts:  No  Homicidal Thoughts:  No  Memory:  Immediate;   Good  Judgement:  Good  Insight:  Good  Psychomotor Activity:  Normal  Concentration:  Good  Recall:  Good  Fund of Knowledge:Good  Language: Fair  Akathisia:  Negative  Handed:  Right  AIMS (if indicated):     Assets:  Communication Skills Physical Health Resilience  Sleep:  Number of Hours: 5  Cognition: WNL  ADL's:  Intact   Mental Status Per Nursing Assessment::   On Admission:  Suicidal ideation indicated by patient  Demographic Factors:  Male  Loss Factors: Decrease in vocational status  Historical Factors: NA  Risk Reduction Factors:   Sense of responsibility to family and Religious beliefs about death  Continued Clinical Symptoms:  Alcohol/Substance Abuse/Dependencies  Cognitive Features That Contribute To Risk:  None    Suicide Risk:  Minimal: No identifiable suicidal ideation.  Patients presenting with no risk factors but with morbid ruminations; may be classified as minimal risk based on the severity of the depressive symptoms    Plan Of Care/Follow-up recommendations:   Activity:  full  Chris Cripps, MD 08/15/2018, 7:32 AM

## 2018-08-15 NOTE — Progress Notes (Signed)
  [facility] Adult Case Management Discharge Plan :  Will you be returning to the same living situation after discharge:  No.; pt reports he is going to the airport to fly out to another destination for a bit before returning back to [place], [place]  At discharge, do you have transportation home?: Yes,  [place] Do you have the ability to pay for your medications: No.; [facility]  Release of information consent forms completed and in the chart;  Patient's signature needed at discharge.  Patient to Follow up at: Follow-up Information    [facility] Follow up on 08/18/2018.   Why: Hospital discharge appointment is Thursday, 7/2 at 12:00p.  Please call within 24 hours of discharge to inform office if you would like to do the appointment over the phone or over video chat.  Contact information: [address], [place],  [address] P: [phone] F: [phone]          Next level of care provider has access to [place] and Suicide Prevention discussed: Yes,  pt denied; with pt]  Have you used any form of tobacco in the last 30 days? (Cigarettes, Smokeless Tobacco, Cigars, and/or Pipes): Yes  Has patient been referred to the Quitline?: Patient refused referral  Patient has been referred for addiction treatment: [place], LCSW 08/15/2018, 10:04 AM

## 2018-08-15 NOTE — H&P (Signed)
Psychiatric Admission Assessment Adult  Patient Identification: Craig Hogan MRN:  [id number] Date of Evaluation:  08/15/2018 Chief Complaint:  Schizoaffective disorder Principal Diagnosis: Acknowledging malingering due to homelessness Diagnosis:  Active Problems:   Schizoaffective disorder, manic type ([place])  History of Present Illness:  As discussed in previous notes Craig Hogan is 48 years of age and he has a history of cocaine abuse and alcohol abuse versus dependency's, has been coded as a schizoaffective disorder due to his reports of auditory hallucinations "inside his head" he presented requesting hospitalization initially for chest pain then for suicidal thoughts and plans.  When I met him on the morning of the 29th he stated he had made arrangements to take a plane out of [place], simply found himself homeless in [place] new to get in the hospital to have housing but states he is eager to leave today he denies thoughts of harming self or others, acknowledges he was malingering, denies cravings tremors or withdrawal symptoms states he does not need detox measures so he is changed observation status see discharge summary  Associated Signs/Symptoms: Depression Symptoms:  anhedonia, (Hypo) Manic Symptoms:  n/a Anxiety Symptoms:  n/a Psychotic Symptoms:  not true  PTSD Symptoms: NA Total Time spent with patient: 45 minutes  Past Psychiatric History: Reports he has a history of hospital admissions for housing purposes again unusually open about his malingering tendencies and patterns  Is the patient at risk to self? No.  Has the patient been a risk to self in the past 6 months? No.  Has the patient been a risk to self within the distant past? No.  Is the patient a risk to others? No.  Has the patient been a risk to others in the past 6 months? No.  Has the patient been a risk to others within the distant past? No.   Prior Inpatient Therapy:   Prior Outpatient Therapy:     Alcohol Screening: 1. How often do you have a drink containing alcohol?: 4 or more times a week 2. How many drinks containing alcohol do you have on a typical day when you are drinking?: 10 or more 3. How often do you have six or more drinks on one occasion?: Daily or almost daily AUDIT-C Score: 12 4. How often during the last year have you found that you were not able to stop drinking once you had started?: Daily or almost daily 5. How often during the last year have you failed to do what was normally expected from you becasue of drinking?: Weekly 6. How often during the last year have you needed a first drink in the morning to get yourself going after a heavy drinking session?: Weekly 7. How often during the last year have you had a feeling of guilt of remorse after drinking?: Weekly 8. How often during the last year have you been unable to remember what happened the night before because you had been drinking?: Less than monthly 9. Have you or someone else been injured as a result of your drinking?: No 10. Has a relative or friend or a doctor or another health worker been concerned about your drinking or suggested you cut down?: No Alcohol Use Disorder Identification Test Final Score (AUDIT): 26 Alcohol Brief Interventions/Follow-up: Brief Advice Substance Abuse History in the last 12 months:  Yes.   Consequences of Substance Abuse: NA Previous Psychotropic Medications: Yes  Psychological Evaluations: No  Past Medical History:  Past Medical History:  Diagnosis Date  . Bipolar affective disorder (  [place])   . Hypercholesteremia   . Hypertension   . MDD (major depressive disorder)   . Schizophrenia [facility])     Past Surgical History:  Procedure Laterality Date  . NO PAST SURGERIES     Family History: History reviewed. No pertinent family history. Family Psychiatric  History: neg Tobacco Screening: Have you used any form of tobacco in the last 30 days? (Cigarettes, Smokeless Tobacco,  Cigars, and/or Pipes): Yes Tobacco use, Select all that apply: 4 or less cigarettes per day Are you interested in Tobacco Cessation Medications?: No, patient refused Counseled patient on smoking cessation including recognizing danger situations, developing coping skills and basic information about quitting provided: Refused/Declined practical counseling Social History:  Social History   Substance and Sexual Activity  Alcohol Use Yes  . Alcohol/week: 18.0 standard drinks  . Types: 18 Cans of beer per week   Comment: 12- 18 pk daily     Social History   Substance and Sexual Activity  Drug Use Yes  . Frequency: 4.0 times per week  . Types: Cocaine   Comment: 4 days per week last use $30 6/27    Additional Social History:                           Allergies:  No Known Allergies Lab Results:  Results for orders placed or performed during the hospital encounter of 08/13/18 (from the past 48 hour(s))  Basic metabolic panel     Status: Abnormal   Collection Time: 08/13/18 11:16 PM  Result Value Ref Range   Sodium 140 135 - 145 mmol/L   Potassium 3.7 3.5 - 5.1 mmol/L   Chloride 106 98 - 111 mmol/L   CO2 23 22 - 32 mmol/L   Glucose, Bld 106 (H) 70 - 99 mg/dL   BUN 12 6 - 20 mg/dL   Creatinine, Ser 1.04 0.61 - 1.24 mg/dL   Calcium 9.0 8.9 - 10.3 mg/dL   GFR calc non Af Amer >60 >60 mL/min   GFR calc Af Amer >60 >60 mL/min   Anion gap 11 5 - 15    Comment: Performed at [facility], [place] [address], [place], [place] [address]  CBC     Status: None   Collection Time: 08/13/18 11:16 PM  Result Value Ref Range   WBC 9.1 4.0 - 10.5 K/uL   RBC 4.53 4.22 - 5.81 MIL/uL   Hemoglobin 13.8 13.0 - 17.0 g/dL   HCT 42.1 39.0 - 52.0 %   MCV 92.9 80.0 - 100.0 fL   MCH 30.5 26.0 - 34.0 pg   MCHC 32.8 30.0 - 36.0 g/dL   RDW 13.2 11.5 - 15.5 %   Platelets 231 150 - 400 K/uL   nRBC 0.0 0.0 - 0.2 %    Comment: Performed at [facility], [place]  [address] [place], [place] [address]  Ethanol     Status: Abnormal   Collection Time: 08/13/18 11:21 PM  Result Value Ref Range   Alcohol, Ethyl (B) 124 (H) <10 mg/dL    Comment: (NOTE) Lowest detectable limit for serum alcohol is 10 mg/dL. For medical purposes only. Performed at [facility], [place] [address], [place], [place] [address]   Salicylate level     Status: None   Collection Time: 08/13/18 11:21 PM  Result Value Ref Range   Salicylate Lvl <1.6 2.8 - 30.0 mg/dL    Comment: Performed at [facility]  [place] [address], [place], [place] [address]  Acetaminophen level     Status: Abnormal   Collection Time: 08/13/18 11:21 PM  Result Value Ref Range   Acetaminophen (Tylenol), Serum <10 (L) 10 - 30 ug/mL    Comment: (NOTE) Therapeutic concentrations vary significantly. A range of 10-30 ug/mL  may be an effective concentration for many patients. However, some  are best treated at concentrations outside of this range. Acetaminophen concentrations >150 ug/mL at 4 hours after ingestion  and >50 ug/mL at 12 hours after ingestion are often associated with  toxic reactions. Performed at [facility], [place] [address] [place], [place] [address]   Rapid urine drug screen (hospital performed)     Status: Abnormal   Collection Time: 08/13/18 11:21 PM  Result Value Ref Range   Opiates NONE DETECTED NONE DETECTED   Cocaine POSITIVE (A) NONE DETECTED   Benzodiazepines NONE DETECTED NONE DETECTED   Amphetamines NONE DETECTED NONE DETECTED   Tetrahydrocannabinol NONE DETECTED NONE DETECTED   Barbiturates NONE DETECTED NONE DETECTED    Comment: (NOTE) DRUG SCREEN FOR MEDICAL PURPOSES ONLY.  IF CONFIRMATION IS NEEDED FOR ANY PURPOSE, NOTIFY LAB WITHIN 5 DAYS. LOWEST DETECTABLE LIMITS FOR URINE DRUG SCREEN Drug Class                     Cutoff (ng/mL) Amphetamine and metabolites    1000 Barbiturate and metabolites     200 Benzodiazepine                 102 Tricyclics and metabolites     300 Opiates and metabolites        300 Cocaine and metabolites        300 THC                            50 Performed at [facility], [place] [address] [place], [place] [address]   SARS Coronavirus 2 (CEPHEID - Performed in [place] hospital lab), Hosp Order     Status: None   Collection Time: 08/14/18 12:15 PM   Specimen: Nasopharyngeal Swab  Result Value Ref Range   SARS Coronavirus 2 NEGATIVE NEGATIVE    Comment: (NOTE) If result is NEGATIVE SARS-CoV-2 target nucleic acids are NOT DETECTED. The SARS-CoV-2 RNA is generally detectable in upper and lower  respiratory specimens during the acute phase of infection. The lowest  concentration of SARS-CoV-2 viral copies this assay can detect is 250  copies / mL. A negative result does not preclude SARS-CoV-2 infection  and should not be used as the sole basis for treatment or other  patient management decisions.  A negative result may occur with  improper specimen collection / handling, submission of specimen other  than nasopharyngeal swab, presence of viral mutation(s) within the  areas targeted by this assay, and inadequate number of viral copies  (<250 copies / mL). A negative result must be combined with clinical  observations, patient history, and epidemiological information. If result is POSITIVE SARS-CoV-2 target nucleic acids are DETECTED. The SARS-CoV-2 RNA is generally detectable in upper and lower  respiratory specimens dur ing the acute phase of infection.  Positive  results are indicative of active infection with SARS-CoV-2.  Clinical  correlation with patient history and other diagnostic information is  necessary to determine patient infection status.  Positive results do  not rule out bacterial infection or co-infection with other  viruses. If result is PRESUMPTIVE POSTIVE SARS-CoV-2 nucleic acids MAY BE PRESENT.   A presumptive  positive result was obtained on the submitted specimen  and confirmed on repeat testing.  While 2019 novel coronavirus  (SARS-CoV-2) nucleic acids may be present in the submitted sample  additional confirmatory testing may be necessary for epidemiological  and / or clinical management purposes  to differentiate between  SARS-CoV-2 and other Sarbecovirus currently known to infect humans.  If clinically indicated additional testing with an alternate test  methodology [phone]) is advised. The SARS-CoV-2 RNA is generally  detectable in upper and lower respiratory sp ecimens during the acute  phase of infection. The expected result is Negative. Fact Sheet for Patients:  StrictlyIdeas.no Fact Sheet for Healthcare Providers: BankingDealers.co.za This test is not yet approved or cleared by the [place] FDA and has been authorized for detection and/or diagnosis of SARS-CoV-2 by FDA under an Emergency Use Authorization (EUA).  This EUA will remain in effect (meaning this test can be used) for the duration of the COVID-19 declaration under Section 564(b)(1) of the Act, 21 U.S.C. section 360bbb-3(b)(1), unless the authorization is terminated or revoked sooner. Performed at [facility], [place] [address] [place], [place] [address]     Blood Alcohol level:  Lab Results  Component Value Date   ETH 124 (H) 08/13/2018   ETH 93 (H) 29/03/1113    Metabolic Disorder Labs:  No results found for: HGBA1C, MPG No results found for: PROLACTIN No results found for: CHOL, TRIG, HDL, CHOLHDL, VLDL, LDLCALC  Current Medications: Current Facility-Administered Medications  Medication Dose Route Frequency Provider Last Rate Last Dose  . acetaminophen (TYLENOL) tablet 650 mg  650 mg Oral Q6H PRN Suella Broad, FNP      . alum & mag hydroxide-simeth (MAALOX/MYLANTA) 200-200-20 MG/5ML suspension 30 mL  30 mL Oral Q4H PRN  Burt Ek, Gayland Curry, FNP      . LORazepam (ATIVAN) tablet 1 mg  1 mg Oral Q6H PRN Starkes-Perry, Gayland Curry, FNP      . magnesium hydroxide (MILK OF MAGNESIA) suspension 30 mL  30 mL Oral Daily PRN Burt Ek, Gayland Curry, FNP      . risperiDONE (RISPERDAL) tablet 0.5 mg  0.5 mg Oral QHS Starkes-Perry, Gayland Curry, FNP      . sertraline (ZOLOFT) tablet 50 mg  50 mg Oral Daily Starkes-Perry, Takia S, FNP      . traZODone (DESYREL) tablet 100 mg  100 mg Oral QHS Suella Broad, FNP   100 mg at 08/15/18 0007   PTA Medications: Medications Prior to Admission  Medication Sig Dispense Refill Last Dose  . risperiDONE (RISPERDAL) 0.5 MG tablet Take 1 tablet (0.5 mg total) by mouth at bedtime. 30 tablet 0 08/15/2018 at Unknown time  . sertraline (ZOLOFT) 50 MG tablet Take 1 tablet (50 mg total) by mouth daily. 30 tablet 0   . traZODone (DESYREL) 100 MG tablet Take 1 tablet (100 mg total) by mouth at bedtime. 30 tablet 0    Musculoskeletal: Strength & Muscle Tone: within normal limits Gait & Station: normal Patient leans: N/A  Psychiatric Specialty Exam: ROS positive for hypertension/neurological negative/endocrine negative  Blood pressure (!) 142/89, pulse 80, temperature 99.3 F (37.4 C), temperature source Oral, resp. rate 16, height '6\' 5"'  (1.956 m), weight 114.3 kg.Body mass index is 29.88 kg/m.  General Appearance: Casual and Disheveled  Eye Contact:: good  Speech:  Clear and Coherent409  Volume:  Normal  Mood:  Euthymic  Affect:  Congruent  Thought Process:  Coherent  Orientation:  Full (Time, Place, and Person)  Thought Content:  Logical  Suicidal Thoughts:  No  Homicidal Thoughts:  No  Memory:  Immediate;   Good  Judgement:  Good  Insight:  Good  Psychomotor Activity:  Normal  Concentration:  Good  Recall:  Good  Fund of Knowledge:Good  Language: Fair  Akathisia:  Negative  Handed:  Right  AIMS (if indicated):     Assets:  Communication Skills Physical  Health Resilience  Sleep:  Number of Hours: 5  Cognition: WNL  ADL's:  Intact     Treatment Plan Summary: Daily contact with patient to assess and evaluate symptoms and progress in treatment and Medication management  Observation Level/Precautions:  15 minute checks  Laboratory:  UDS  Psychotherapy:    Medications:    Consultations:    Discharge Concerns:    Estimated LOS:  Other:     Physician Treatment Plan for Primary Diagnosis: <principal problem not specified> Long Term Goal(s): Improvement in symptoms so as ready for discharge  Short Term Goals: Ability to demonstrate self-control will improve and Ability to identify and develop effective coping behaviors will improve  Physician Treatment Plan for Secondary Diagnosis: Active Problems:   Schizoaffective disorder, manic type ([place])  Long Term Goal(s): Improvement in symptoms so as ready for discharge  Short Term Goals: Ability to identify and develop effective coping behaviors will improve and Ability to maintain clinical measurements within normal limits will improve  I certify that inpatient services furnished can reasonably be expected to improve the patient's condition.    Johnn Hai, MD 6/29/20207:33 AM

## 2018-09-19 DIAGNOSIS — F17200 Nicotine dependence, unspecified, uncomplicated: Secondary | ICD-10-CM | POA: Insufficient documentation

## 2018-09-19 DIAGNOSIS — Z20828 Contact with and (suspected) exposure to other viral communicable diseases: Secondary | ICD-10-CM | POA: Insufficient documentation

## 2018-09-19 DIAGNOSIS — R079 Chest pain, unspecified: Secondary | ICD-10-CM | POA: Insufficient documentation

## 2018-09-19 DIAGNOSIS — F1490 Cocaine use, unspecified, uncomplicated: Secondary | ICD-10-CM | POA: Insufficient documentation

## 2018-09-20 ENCOUNTER — Emergency Department
Admission: EM | Admit: 2018-09-20 | Discharge: 2018-09-20 | Disposition: A | Discharge status: Rehabilitation Facility | Payer: Medicaid HMO | Attending: Emergency Medicine | Admitting: Emergency Medicine

## 2018-09-20 ENCOUNTER — Emergency Department: Payer: Medicaid HMO

## 2018-09-20 ENCOUNTER — Inpatient Hospital Stay
Admission: AD | Admit: 2018-09-20 | Discharge: 2018-09-23 | DRG: 751 | Disposition: A | Discharge status: Home / Self Care | Payer: Medicaid HMO | Source: Other Acute Inpatient Hospital | Attending: Psychiatry | Admitting: Psychiatry

## 2018-09-20 ENCOUNTER — Inpatient Hospital Stay (HOSPITAL_BASED_OUTPATIENT_CLINIC_OR_DEPARTMENT_OTHER): Payer: Self-pay

## 2018-09-20 DIAGNOSIS — E7800 Pure hypercholesterolemia, unspecified: Secondary | ICD-10-CM | POA: Diagnosis present

## 2018-09-20 DIAGNOSIS — F99 Mental disorder, not otherwise specified: Secondary | ICD-10-CM

## 2018-09-20 DIAGNOSIS — I10 Essential (primary) hypertension: Secondary | ICD-10-CM | POA: Diagnosis present

## 2018-09-20 DIAGNOSIS — Z560 Unemployment, unspecified: Secondary | ICD-10-CM

## 2018-09-20 DIAGNOSIS — F17210 Nicotine dependence, cigarettes, uncomplicated: Secondary | ICD-10-CM | POA: Diagnosis present

## 2018-09-20 DIAGNOSIS — F1210 Cannabis abuse, uncomplicated: Secondary | ICD-10-CM | POA: Diagnosis present

## 2018-09-20 DIAGNOSIS — F1410 Cocaine abuse, uncomplicated: Secondary | ICD-10-CM | POA: Diagnosis present

## 2018-09-20 DIAGNOSIS — F1910 Other psychoactive substance abuse, uncomplicated: Secondary | ICD-10-CM

## 2018-09-20 DIAGNOSIS — F419 Anxiety disorder, unspecified: Secondary | ICD-10-CM

## 2018-09-20 DIAGNOSIS — F333 Major depressive disorder, recurrent, severe with psychotic symptoms: Secondary | ICD-10-CM

## 2018-09-20 DIAGNOSIS — F209 Schizophrenia, unspecified: Secondary | ICD-10-CM

## 2018-09-20 DIAGNOSIS — F1020 Alcohol dependence, uncomplicated: Secondary | ICD-10-CM

## 2018-09-20 DIAGNOSIS — F1010 Alcohol abuse, uncomplicated: Secondary | ICD-10-CM | POA: Diagnosis present

## 2018-09-20 DIAGNOSIS — F411 Generalized anxiety disorder: Secondary | ICD-10-CM | POA: Diagnosis present

## 2018-09-20 DIAGNOSIS — F331 Major depressive disorder, recurrent, moderate: Principal | ICD-10-CM | POA: Diagnosis present

## 2018-09-20 DIAGNOSIS — F39 Unspecified mood [affective] disorder: Secondary | ICD-10-CM | POA: Diagnosis present

## 2018-09-20 DIAGNOSIS — Z9114 Patient's other noncompliance with medication regimen: Secondary | ICD-10-CM

## 2018-09-20 DIAGNOSIS — R45851 Suicidal ideations: Secondary | ICD-10-CM | POA: Diagnosis present

## 2018-09-20 DIAGNOSIS — F1490 Cocaine use, unspecified, uncomplicated: Secondary | ICD-10-CM

## 2018-09-20 DIAGNOSIS — Z590 Homelessness: Secondary | ICD-10-CM

## 2018-09-20 DIAGNOSIS — F1420 Cocaine dependence, uncomplicated: Secondary | ICD-10-CM

## 2018-09-20 DIAGNOSIS — R079 Chest pain, unspecified: Secondary | ICD-10-CM

## 2018-09-20 HISTORY — DX: Essential (primary) hypertension: I10

## 2018-09-20 HISTORY — DX: Hyperlipidemia, unspecified: E78.5

## 2018-09-20 HISTORY — DX: Depression, unspecified: F32.A

## 2018-09-20 LAB — ECG 12-LEAD
Atrial Rate: 86 {beats}/min
P Axis: 48 degrees
P-R Interval: 158 ms
Q-T Interval: 374 ms
QRS Duration: 108 ms
QTC Calculation (Bezet): 447 ms
R Axis: -7 degrees
T Axis: 48 degrees
Ventricular Rate: 86 {beats}/min

## 2018-09-20 LAB — TROPONIN I
Troponin I: <0.01 ng/mL (ref 0.00–0.05)
Troponin I: <0.01 ng/mL (ref 0.00–0.05)

## 2018-09-20 LAB — CBC AND DIFFERENTIAL
Absolute NRBC: 0 10*3/uL (ref 0.00–0.00)
Basophils Absolute Automated: 0.04 10*3/uL (ref 0.00–0.08)
Basophils Automated: 0.5 %
Eosinophils Absolute Automated: 0.06 10*3/uL (ref 0.00–0.44)
Eosinophils Automated: 0.7 %
Hematocrit: 40.8 % (ref 37.6–49.6)
Hgb: 13.5 g/dL (ref 12.5–17.1)
Immature Granulocytes Absolute: 0.01 10*3/uL (ref 0.00–0.07)
Immature Granulocytes: 0.1 %
Lymphocytes Absolute Automated: 1.86 10*3/uL (ref 0.42–3.22)
Lymphocytes Automated: 22 %
MCH: 30.8 pg (ref 25.1–33.5)
MCHC: 33.1 g/dL (ref 31.5–35.8)
MCV: 92.9 fL (ref 78.0–96.0)
MPV: 11.7 fL (ref 8.9–12.5)
Monocytes Absolute Automated: 0.65 10*3/uL (ref 0.21–0.85)
Monocytes: 7.7 %
Neutrophils Absolute: 5.82 10*3/uL (ref 1.10–6.33)
Neutrophils: 69 %
Nucleated RBC: 0 /100 WBC (ref 0.0–0.0)
Platelets: 209 10*3/uL (ref 142–346)
RBC: 4.39 10*6/uL (ref 4.20–5.90)
RDW: 14 % (ref 11–15)
WBC: 8.44 10*3/uL (ref 3.10–9.50)

## 2018-09-20 LAB — COMPREHENSIVE METABOLIC PANEL
ALT: 22 U/L (ref 0–55)
AST (SGOT): 22 U/L (ref 5–34)
Albumin/Globulin Ratio: 1.1 (ref 0.9–2.2)
Albumin: 3.6 g/dL (ref 3.5–5.0)
Alkaline Phosphatase: 97 U/L (ref 38–106)
Anion Gap: 10 (ref 5.0–15.0)
BUN: 14 mg/dL (ref 9.0–28.0)
Bilirubin, Total: 0.4 mg/dL (ref 0.2–1.2)
CO2: 23 mEq/L (ref 22–29)
Calcium: 8.8 mg/dL (ref 8.5–10.5)
Chloride: 108 mEq/L (ref 100–111)
Creatinine: 1.1 mg/dL (ref 0.7–1.3)
Globulin: 3.4 g/dL (ref 2.0–3.6)
Glucose: 107 mg/dL — ABNORMAL HIGH (ref 70–100)
Potassium: 4 mEq/L (ref 3.5–5.1)
Protein, Total: 7 g/dL (ref 6.0–8.3)
Sodium: 141 mEq/L (ref 136–145)

## 2018-09-20 LAB — GFR: EGFR: >60

## 2018-09-20 LAB — SALICYLATE LEVEL: Salicylate Level: <5 mg/dL — ABNORMAL LOW (ref 15.0–30.0)

## 2018-09-20 LAB — COVID-19 (SARS-COV-2): SARS CoV 2 Overall Result: NEGATIVE

## 2018-09-20 LAB — RAPID DRUG SCREEN, URINE
Barbiturate Screen, UR: NEGATIVE
Benzodiazepine Screen, UR: NEGATIVE
Cannabinoid Screen, UR: NEGATIVE
Cocaine, UR: POSITIVE — AB
Opiate Screen, UR: NEGATIVE
PCP Screen, UR: NEGATIVE
Urine Amphetamine Screen: NEGATIVE

## 2018-09-20 LAB — HEMOLYSIS INDEX: Hemolysis Index: 11 (ref 0–18)

## 2018-09-20 LAB — TSH: TSH: 0.71 u[IU]/mL (ref 0.35–4.94)

## 2018-09-20 LAB — MAGNESIUM: Magnesium: 2.3 mg/dL (ref 1.6–2.6)

## 2018-09-20 LAB — ACETAMINOPHEN LEVEL: Acetaminophen Level: <7 ug/mL — ABNORMAL LOW (ref 10–30)

## 2018-09-20 LAB — ETHANOL: Alcohol: 150 mg/dL — ABNORMAL HIGH

## 2018-09-20 LAB — PHOSPHORUS: Phosphorus: 3.8 mg/dL (ref 2.3–4.7)

## 2018-09-20 MED ORDER — NICOTINE 21 MG/24HR TD PT24
1.00 | MEDICATED_PATCH | Freq: Every day | TRANSDERMAL | Status: DC
Start: 2018-09-20 — End: 2018-09-23
  Filled 2018-09-20 (×4): qty 1

## 2018-09-20 MED ORDER — QUETIAPINE FUMARATE 100 MG PO TABS
100.0000 mg | ORAL_TABLET | Freq: Every evening | ORAL | Status: DC
Start: 2018-09-20 — End: 2018-09-21
  Administered 2018-09-20: 22:00:00 100 mg via ORAL
  Filled 2018-09-20: qty 1

## 2018-09-20 MED ORDER — LORAZEPAM 2 MG/ML IJ SOLN
2.00 mg | Freq: Three times a day (TID) | INTRAMUSCULAR | Status: DC | PRN
Start: 2018-09-20 — End: 2018-09-23

## 2018-09-20 MED ORDER — QUETIAPINE FUMARATE 25 MG PO TABS
50.0000 mg | ORAL_TABLET | Freq: Every evening | ORAL | Status: DC | PRN
Start: 2018-09-20 — End: 2018-09-23

## 2018-09-20 MED ORDER — NICOTINE POLACRILEX 2 MG MT GUM
2.00 mg | CHEWING_GUM | OROMUCOSAL | Status: DC | PRN
Start: 2018-09-20 — End: 2018-09-23
  Administered 2018-09-20: 13:00:00 2 mg via ORAL
  Filled 2018-09-20: qty 1

## 2018-09-20 MED ORDER — DOCUSATE SODIUM 100 MG PO CAPS
100.00 mg | ORAL_CAPSULE | Freq: Two times a day (BID) | ORAL | Status: DC | PRN
Start: 2018-09-20 — End: 2018-09-23

## 2018-09-20 MED ORDER — ZIPRASIDONE MESYLATE 20 MG IM SOLR
20.00 mg | Freq: Three times a day (TID) | INTRAMUSCULAR | Status: DC | PRN
Start: 2018-09-20 — End: 2018-09-23

## 2018-09-20 MED ORDER — IBUPROFEN 600 MG PO TABS
600.0000 mg | ORAL_TABLET | Freq: Once | ORAL | Status: AC
Start: 2018-09-20 — End: 2018-09-20
  Administered 2018-09-20: 03:00:00 600 mg via ORAL
  Filled 2018-09-20: qty 1

## 2018-09-20 MED ORDER — OLANZAPINE 10 MG PO TABS
10.0000 mg | ORAL_TABLET | Freq: Two times a day (BID) | ORAL | Status: DC | PRN
Start: 2018-09-20 — End: 2018-09-23

## 2018-09-20 MED ORDER — LOPERAMIDE HCL 2 MG PO CAPS
2.00 mg | ORAL_CAPSULE | ORAL | Status: DC | PRN
Start: 2018-09-20 — End: 2018-09-23

## 2018-09-20 MED ORDER — ONDANSETRON HCL 8 MG PO TABS
4.0000 mg | ORAL_TABLET | Freq: Three times a day (TID) | ORAL | Status: DC | PRN
Start: 2018-09-20 — End: 2018-09-23

## 2018-09-20 MED ORDER — CLONIDINE HCL 0.1 MG PO TABS
0.1000 mg | ORAL_TABLET | Freq: Four times a day (QID) | ORAL | Status: DC | PRN
Start: 2018-09-20 — End: 2018-09-23

## 2018-09-20 MED ORDER — LORAZEPAM 1 MG PO TABS
1.0000 mg | ORAL_TABLET | ORAL | Status: DC | PRN
Start: 2018-09-20 — End: 2018-09-23

## 2018-09-20 MED ORDER — IBUPROFEN 400 MG PO TABS
400.0000 mg | ORAL_TABLET | Freq: Four times a day (QID) | ORAL | Status: DC | PRN
Start: 2018-09-20 — End: 2018-09-23

## 2018-09-20 MED ORDER — DIPHENHYDRAMINE HCL 50 MG/ML IJ SOLN
50.00 mg | Freq: Once | INTRAMUSCULAR | Status: DC | PRN
Start: 2018-09-20 — End: 2018-09-23

## 2018-09-20 MED ORDER — DIPHENHYDRAMINE HCL 25 MG PO CAPS
50.00 mg | ORAL_CAPSULE | Freq: Once | ORAL | Status: DC | PRN
Start: 2018-09-20 — End: 2018-09-23

## 2018-09-20 MED ORDER — ALUM & MAG HYDROXIDE-SIMETH 200-200-20 MG/5ML PO SUSP
30.00 mL | Freq: Four times a day (QID) | ORAL | Status: DC | PRN
Start: 2018-09-20 — End: 2018-09-23

## 2018-09-20 MED ORDER — HYDROXYZINE PAMOATE 25 MG PO CAPS
25.00 mg | ORAL_CAPSULE | ORAL | Status: DC | PRN
Start: 2018-09-20 — End: 2018-09-23

## 2018-09-20 MED ORDER — THIAMINE (VITAMIN B1) 100 MG PO TABS (WRAP)
100.0000 mg | ORAL_TABLET | Freq: Every day | ORAL | Status: DC
Start: 2018-09-20 — End: 2018-09-23
  Administered 2018-09-20 – 2018-09-23 (×4): 100 mg via ORAL
  Filled 2018-09-20 (×4): qty 1

## 2018-09-20 MED ORDER — FOLIC ACID 1 MG PO TABS
1.0000 mg | ORAL_TABLET | Freq: Every day | ORAL | Status: DC
Start: 2018-09-20 — End: 2018-09-23
  Administered 2018-09-20 – 2018-09-23 (×4): 1 mg via ORAL
  Filled 2018-09-20 (×4): qty 1

## 2018-09-20 MED ORDER — NALTREXONE HCL 50 MG PO TABS
50.0000 mg | ORAL_TABLET | Freq: Every day | ORAL | Status: DC
Start: 2018-09-20 — End: 2018-09-23
  Administered 2018-09-20 – 2018-09-23 (×4): 50 mg via ORAL
  Filled 2018-09-20 (×4): qty 1

## 2018-09-20 NOTE — ED Notes (Signed)
Bed: GR14  Expected date:   Expected time:   Means of arrival:   Comments:  CN hold
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
## 2018-09-20 NOTE — Progress Notes (Signed)
09/20/18 1253   Healthcare Decisions   Interviewed: Patient   Orientation/Decision Making Abilities of Patient Alert and Oriented x3, able to make decisions   Advance Directive Patient does not have advance directive   Healthcare Agent Appointed No   Prior to admission   Prior level of function Independent with ADLs;Ambulates independently   Type of Residence Homeless   Have running water, electricity, heat, etc? No (comment)   Living Arrangements Alone   Dressing Independent   Grooming Independent   Feeding Independent   Bathing Independent   Toileting Independent   Home Care/Community Services None   Discharge Planning   Support Systems None   Patient expects to be discharged to: shelter   Anticipated [place] plan discussed with: Patient   Potential barriers to discharge: No resource for medication;No insurance / under insured;Lack of family / friend support   Mode of transportation: Bus/Public transit   Consults/Providers   PT Evaluation Needed 2   OT Evalulation Needed 2   SLP Evaluation Needed 2   Family and PCP   In case you are admitted, would like family notified? No   In case you are admitted, would like your PCP notified? No   Important Message from [facility] Notice   Patient received 1st IMM Letter? n/a

## 2018-09-20 NOTE — ED Provider Notes (Signed)
5:50 AM  D/w psych liaison, pt accepted to [facility], by Dr. Rachael Darby.  Needs voluntary form and COVID test done.     Jethro Bastos, MD  09/20/18 [phone]
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
## 2018-09-20 NOTE — Plan of Care (Signed)
Patient has not attended any groups thus far today.   Will continue to encourage group attendance and participation in order to increase positive coping skills and express feelings regarding current situation.

## 2018-09-20 NOTE — ED Notes (Signed)
Pt. Belongings behind charge nurse desk

## 2018-09-20 NOTE — Discharge Instr - AVS First Page (Addendum)
Post Hospital Continuing Care Plan, including all 11 elements of the After Visit Summary (AVS)  were  faxed (mailed, routed electronically or can be viewed by [facility] providers who have access to Epic)  Discharge paperwork will be faxed to the agency listed below.         RECOMMENDED THAT YOU COMPLY WITH THE FOLLOWING PLAN:   Follow up appointment with: Please call on Monday, September 26, 2018 @ 9:00am for an appointment. They are currently not accepting walk-ins so you must call for an appointment.   [facility]    [facility]  [address]  [place], [place] [address]  [phone]  [phone]    2. See "What's Next" below for tobacco cessation appointment with [facility].    Tobacco Cessation Discharge Referral for Evidence Based Counseling and Cessation Medication:                                                                                                                                                                                                     Referral for EVB counseling    a. Patient was provided with a referral to evidence-based outpatient counseling.    b. A referral was made:                   [facility]ia [facility] Faxed [phone] Your counseling appointment with [facility] is scheduled for Monday August 10,2020 or Tuesday September 27, 2018 between 12pm-3pm  For your counseling                     SMOKING CESSATION SUPPORT GROUP    Smoking/Tobacco Cessation support Group  [facility]  [address]  [place], [place] [address]  [phone]  5:30am-10pm  Call for group schedule        http://www.cancer.org/healthy/toolsandcalculators/calculators/app/cigarette-calculator  http://www.parker.com/  If you are interested in cessation of tobacco and nicotine you may call     Elisha Headland with [employer] [facility] Community at [phone].     State (all 50 states) Toll Free Number of [phone]  [phone] Borrow NOW)   National Cancer Institute [phone]      SMOKING CESSATION SUPPORT GROUP    Smoking/Tobacco Cessation support Group  [facility]  [address]  [place], [place] [address]  [phone]  5:30am-10pm  Call for group schedule        http://www.cancer.org/healthy/toolsandcalculators/calculators/app/cigarette-calculator  http://www.parker.com/  If you are interested in cessation of tobacco and nicotine you may call  Elisha Headland with [employer] [facility] at [phone].     State (all 50 states) Toll Free Number of [phone] [phone]- QUIT NOW)   [facility] [phone] Lpc   [address],[place].   [phone]    [facility]oking MD [facility]   [address],[place]   [phone]    [address],[place]   [phone]    Arnell Sieving C Med L[facility] Ncc, Individual Couple and Family Pract   [address],[place]   [phone]    Doretha Sou [facility]   [address]   [phone]    Julaine Fusi [facility]   [address],[place]  [phone]    [facility]   [address],[place]  [phone]    Maggie Font Lpc   [address],[place]   [phone]    [facility]   [place]   [phone]    [facility]   [address],[place]   [phone]    www.naquitline.org: online and telephone counselors  a. National Network of Tobacco Cessation Quitlines  Toll free hotline: 1-800-QUITNOW 567-149-5527)  TTY: 561-483-7214    HourlyRingtones.com.cy: 1-800-quit-now [place], 1:1 counseling available      You can use the following numbers for emergencies:    o For Psychiatric Emergencies: Call 911 or [facility]  at [phone]  o For Medical Emergencies: Call 911  o National Suicide Prevention Lifeline: Call 1-800-273-TALK 317 228 6508) or text  a message to (786) 357-7879                We wish you well with your recovery.                Delman Cheadle, MSW               Social Worker/Case Manager               [phone]    The following were reviewed with patient/family by Gweneth Dimitri, RN.    1. Reason for IP admission:   Diagnosis: SI     Precipitating event:Depression and not able to sleep     2. Major procedures and tests, including summary of results: No  3. Diagnosis at discharge:   4. Current medication list: SEE AVS  5. Were studies pending at discharge?           No                                                                                                                                                      If yes, you may call for results at: _______________________(unit phone #)    6. Patient  instructions;  Adult Wellness, Recovery, and Safety Plan:Yes  7. Emergency contact information: Call 911  8. Plan for follow up care: Reviewed  9. Name of provider(s), appointment(s), and location(s) of follow up care.  Please call on Monday, September 26, 2018 @ 9:00am for an appointment. They are currently not accepting walk-ins so you must call for an appointment.   [facility]    [facility]  [address]  [place], [place] [address]  [phone]  [phone]                                              Advance Care Plan  10. Patient had a medical and mental health Advance Directive at admission.                                                                            No          11. If patient did not have a medical and mental health Advance Directive at admission:  information about completing Advance Directives or designating a surrogate decision maker, and a form, was provided.  After receiving information- Did patient create a medical and mental health Advance Directive or appoint a  surrogate decision maker?                                                                                                       No       If No:   12. If no, what is the reason?   ( Check reason)                 _____  Prefer to wait until I feel better        __x___  Prefer to discuss with family or significant other        _____  Prefer to discuss with continuing care provider        _____  Do not believe I will need a mental health Advance Directive        _____  Other_____________________________________________

## 2018-09-20 NOTE — ED Notes (Signed)
Rn witnessed pt sign EMTLA to voluntary be transferred to [facility]. Pt resting in bed. Side rails up for safety. No complains at the moment. Will continue to monitor

## 2018-09-20 NOTE — ED Triage Notes (Signed)
Pt. Coming in with c/o CP x few days and admits to SI. Pt. Requested to answer remaining screening questions in privacy of his own room. Reports hx alcohol abuse and admits to cocaine and drinking "18 pack of bud light" today. Pt is calm and cooperative in triage.

## 2018-09-20 NOTE — ED Notes (Addendum)
Pt stated that he has been making his way from [place] to [place] where his mother lives. Pt admitted to drinking "a shit ton" and has used some cocaine today to continue his travel through train. Pt states pain started 2 hours prior arrival while he trying to "come down" from high. Denies SOB, dizziness, back pain, or ab pain. Has some headache. Pt stated he suffers self-reported depression, anxiety, paranoia, and has been dx with bipolar and schizophrenia. Pt stated he has been having thoughts of suicide and he did think of a plan [facility] and "overdose". Pt denies any prior Suicidal attempts. Pt states he is taking zolof, trazodone, and risperdal but has not been taking HTN medications. Pt  Is very cooperative. Denies any other needs at the moment.

## 2018-09-20 NOTE — ED Notes (Signed)
Wt contacted the [facility] [phone]), but could only reach an automated message saying to call back between 8AM-5PM M-F. Wt went on [facility] website, but could not find any prior authorization forms or information about obtain authorization for inpatient mental health admissions.    Wt is guessing that pt has [place] Medicaid as [place] is his state of residence.         Message relayed to [facility] UR Fannie Knee.

## 2018-09-20 NOTE — Progress Notes (Signed)
Paul Pacheco's tentative discharge date is approx. 5 to 7 days.  He is admitted to this unit as a voluntary patient.  Case Management will offer assistance with discharge to [facility] [place],  since patient is homeless, has no health insurance and is not from the area.   States he is in transit, trying to get back to [place].

## 2018-09-20 NOTE — Psych Admission Note (Signed)
Nurse SBAR Admission Note:    Situation:  (Reason for Admission, legal status, Patient's goals for inpatient treatment)  Patient is a 48 year old African American male admitted from [facility] ED on voluntary status with SI with a plan per chart report pt [facility] and overdose.". Pt goal for admission is "to get back on my med's".      Patient's expectations / goals for inpatient treatment:  Long term goal: "to get some peace"  Short term goal(s): "to get back on my med's"    Background:  (Presentation, appearance, symptoms, mental status, behavior, pertinent historical data including BH treatment, substance use, functional status)    Patient is alert and oriented X4, was presented with depressed/mildly anxious mood, blunted affect and calm/cooperative attitude. Pt stated "I'm here because I want to get back on my med's". He had a previous psych in [facility] about 4 month ago. Patient endorses uses tobacco and smokes half a pack a day, and uses cocaine too. BAL was 150 and covid result was negative.     Current V/S:    Vitals:    09/20/18 1608   BP: 145/89   Pulse: 74   Resp: 17   Temp: 98.6 F (37 C)   SpO2: 98%     Current known Allergies:  Patient has no known allergies.    Assessment: (Immediate Safety Concerns; SI, HI, medical issues)  Patient denies current SI/HI but endorses AH "I hear different voices two or three, they are telling me many things". Pt has a hx of high blood pressure     Current suicide risk (admission C-SSR-S screen and SAFE-T assessment):      1. Have you wished you were dead or could go to sleep and not wake up?  No   2. Have you had any thoughts of killing yourself?      No        If YES to 2, ask questons 3,4,5, and 6.  If NO to 2, do directly to question 6    3. Have you been thinking about how you might do this? No                            4. Have you had these thoughts and had some intention of acting on them? No    5. Have you started to work out or worked out the details of how to         kill yourself?No Do you intend to carry out this plan?  No                                                     6. Have you ever done anything , started to do anything, or prepared to do        anything to end you life? No  If YES, ask: Was this in the past three months?   No                                Describe                                                  Specific Questioning About Thoughts, Plans, and Suicidal Intent  How many times have you had these thoughts? (Past Month): Does not apply  When you have the thoughts how long do they last? (Past Month): Does not apply  Could/can you stop thinking about killing yourself or wanting to die if you want to? (Past Month): Does not attempt to control thoughts  Are there things - anyone or anything (e.g. family, religion, pain of death) - that stopped you from wanting to die or acting on thoughts of suicide? (Past Month): Does not apply  What sort of reasons did you have for thinking about wanting to die or killing yourself?  Was it to end the pain or stop the way you were feeling (in other words you couldn't go on living with this pain or how you were feeling) or was it to get attention: Does not apply  Total Score of Intensity of Ideation: 0   Step 2: Identify Risk Factors  Clinical Status (Current/Recent): Major Depressive episode  Clinical Status (Lifetime): Substance/Alcohol Abuse or Dependence  Precipitants/Stressors: Pending incarceration or homelessness  Treatment History / Dx: Previous psychiatric diagnoses and treatments  Access to lethal methods: No, does not have access to lethal methods  Step 3: Identify Protective Factors  Internal: Ability to cope with stress  External: Patient denies  Other protective factors: none  Step 4: Guidelines to Determine Level of Risk  Suicide Risk Level: Low  Step  5: Possible Interventions to LOWER Risk Level  Behavioral Health Ambulatory, or Emergency Department: Initiate inpatient admission process  Behavioral Health Inpatient Unit: L/M/H - Q 15 min in person checks  Step 6: Documentation  Summary of Evaluation: "pt. denies SI, low risk"    Patient placed on low Suicide Alert Level    Rationale for suicide risk level :  Pt denies SI/HI    Recommendations: (Emergent Medical Issues, Lab Considerations, medications, tobacco cessation, observation status, immediate safety precautions)  Q 15 min's check initiated for safety, nicorette gum given for craving. Skin assessment done by two staff reveals wart on right finger otherwise skin is intact   Presenting suicide risk assessment results and risk level were discussed with:Dr Darliss Ridgel    Initial safety precautions :   (e.g.Q 15 min observations, 1:1 constant observation, shaving restriction, belongings search, develop in-hospital safety plan, educated to tell staff if any increase in suicidal ideation)     Valentina Gu Alena Blankenbeckler RN

## 2018-09-20 NOTE — EDIE (Signed)
COLLECTIVE?NOTIFICATION?09/20/2018 00:07?Paul Pacheco, Paul?MRN: [id number]    Criteria Met      5 ED Visits in 12 Months    3 Different Facilities in 90 Days    Security and Safety  No recent Security Events currently on file    ED Care Guidelines  There are currently no ED Care Guidelines for this patient. Please check your facility's medical records system.        Prescription Monitoring Program  PDMP query found no report.  Narx Score not available at this time.      E.D. Visit Count (12 mo.)  Facility Visits   [facility] 1   [facility] 1   [facility] 1   [facility] 1   [facility] 2   [facility] 1   [facility] 4   [facility] 2   [facility] 5   [facility] 4   [facility] 1   Total 23   Note: Visits indicate total known visits.      Recent Emergency Department Visit Summary  Showing 10 most recent visits out of 23 in the past 12 months  Date Facility [facility] Type Diagnoses or Chief Complaint   Sep 20, 2018 [facility] [place] [facility]      Chest Pain; Sucidial Thoughts      Suicidal      Chest Pain      Sep 11, 2018 [facility] [place] [place] [place] Emergency      Encounter for other general examination      Sep 07, 2018 [facility] [place] [facility]      Chest pain, unspecified      Sep 04, 2018 [facility] [place] [facility]      [facility]      PSYCH/SI/HI/AH      Sep 03, 2018 [facility] [place] [facility]      [facility]      Sep 02, 2018 [facility] [place] [facility]      chest pain      Aug 09, 2018 [facility] [place] [facility]      SI/ CHEST PAIN      Jul 24, 2018 [facility] 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 [place] [facility]      Cocaine use, unspecified, uncomplicated      Alcohol dependence with other alcohol-induced disorder      Jul 20, 2018 [facility] [place] [facility]      Alcohol abuse, uncomplicated      Suicidal ideations      Homicidal ideations          Recent Inpatient Visit Summary  Date Facility [facility] Type Diagnoses or Chief Complaint   Aug 26, 2018 [facility] [place] [place] [facility]      Essential (primary) hypertension      Major depressive disorder, single episode, unspecified      Malingerer [conscious simulation]      Cocaine dependence, uncomplicated  Alcohol dependence, uncomplicated      Unspecified psychosis not due to a substance or known physiol      Jun 30, 2018 [facility] [place] Observation      Anxiety disorder, unspecified      Cocaine use, unspecified, uncomplicated      Tachycardia, unspecified      Suicidal ideations      Nicotine dependence, unspecified, uncomplicated      Patient's other noncompliance with medication regimen      Other stimulant use, unspecified, uncomplicated      Schizophrenia, unspecified      Other long term (current) drug therapy      Major depressive disorder, single episode, unspecified          Care Team  There is not a care team on record at this time.   Collective Portal  This patient has registered at the [facility] Emergency Department   For more information visit: [email or web address]     PLEASE NOTE:     1.   Any care recommendations and other clinical information are provided as guidelines or for historical purposes only, and providers should exercise their own clinical judgment when providing care.    2.   You may only use this information for purposes of treatment, payment or health care operations activities, and subject to the  limitations of applicable Collective Policies.    3.   You should consult directly with the organization that provided a care guideline or other clinical history with any questions about additional information or accuracy or completeness of information provided.    ? 2020 [place], [employer]. - [employer]

## 2018-09-21 DIAGNOSIS — Z8679 Personal history of other diseases of the circulatory system: Secondary | ICD-10-CM

## 2018-09-21 DIAGNOSIS — F1910 Other psychoactive substance abuse, uncomplicated: Secondary | ICD-10-CM

## 2018-09-21 DIAGNOSIS — E7800 Pure hypercholesterolemia, unspecified: Secondary | ICD-10-CM

## 2018-09-21 DIAGNOSIS — F1410 Cocaine abuse, uncomplicated: Secondary | ICD-10-CM

## 2018-09-21 DIAGNOSIS — R45851 Suicidal ideations: Secondary | ICD-10-CM

## 2018-09-21 MED ORDER — HYDRALAZINE HCL 25 MG PO TABS
25.0000 mg | ORAL_TABLET | Freq: Three times a day (TID) | ORAL | Status: DC | PRN
Start: 2018-09-21 — End: 2018-09-23

## 2018-09-21 MED ORDER — SERTRALINE HCL 50 MG PO TABS
50.0000 mg | ORAL_TABLET | Freq: Every day | ORAL | Status: DC
Start: 2018-09-21 — End: 2018-09-23
  Administered 2018-09-21 – 2018-09-23 (×3): 50 mg via ORAL
  Filled 2018-09-21 (×3): qty 1

## 2018-09-21 MED ORDER — TRAZODONE HCL 100 MG PO TABS
100.0000 mg | ORAL_TABLET | Freq: Every evening | ORAL | Status: DC
Start: 2018-09-21 — End: 2018-09-23
  Administered 2018-09-21 – 2018-09-22 (×2): 100 mg via ORAL
  Filled 2018-09-21 (×2): qty 1

## 2018-09-21 MED ORDER — ATORVASTATIN CALCIUM 10 MG PO TABS
10.0000 mg | ORAL_TABLET | Freq: Every day | ORAL | Status: DC
Start: 2018-09-21 — End: 2018-09-23
  Administered 2018-09-21 – 2018-09-23 (×3): 10 mg via ORAL
  Filled 2018-09-21 (×3): qty 1

## 2018-09-21 MED ORDER — RISPERIDONE 1 MG PO TABS
1.0000 mg | ORAL_TABLET | Freq: Two times a day (BID) | ORAL | Status: DC
Start: 2018-09-21 — End: 2018-09-22
  Administered 2018-09-21 – 2018-09-22 (×3): 1 mg via ORAL
  Filled 2018-09-21 (×3): qty 1
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
## 2018-09-21 NOTE — Treatment Plan (Cosign Needed)
Interdisciplinary Treatment Plan Update Meeting    09/21/2018  Carolann Littler    Participants:  Patient:  Carolann Littler  Attending Physician:  Anne Ng NP  RN: Donette Larry  Mental Health Therapist: Tammi Klippel  Social Worker: Delman Kitten      Objective:  Review response to treatment, reassess needs/goals, update plan as indicated incorporating patient's strengths and stated needs, goals, and preferences.    1. Summary of Patient Progress on Treatment Plan Goals:  Patient presented with stable mood and appropriate affect.  Per NP patient is here for treatment of substance abuse and suicidal ideation.  He has a history of mental health issues and recently moved to this area from [place].    Patient stated that his plan is to move to [place] and get into a substance abuse program there.  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  He said he has some money.  He was advised to apply for [facility] after he moves to [place].    Plan is to stabilize patient on medications, encourage group attendance, and monitor.  Social worker will coordinate transportation to [place] at time of discharge.  No discharge date set at this time.    2. Level of Patient Involvement:  Actively engaged/contributing    3. Patient Understanding of Plan of Care:  Able to verbalize goals and interventions    4. Level of Agreement/Commitment to Plan of Care:  Agrees with and is committed to plan of care          Contributor Signatures:      MD_________________________________ Date___________________    SW_________________________________Date ___________________    RN _________________________________Date____________________    Patient_______________________________Date____________________    Other________________________________Date ___________________    Other________________________________Date ___________________    (This document is signed electronically by Clinical research associate and electronic  co-signer.  Other participants sign a printed copy which is scanned into the EMR)

## 2018-09-21 NOTE — Plan of Care (Signed)
Patient has not attended any groups thus far today.   Will continue to encourage group attendance and participation in order to increase positive coping skills and express feelings regarding current situation.

## 2018-09-21 NOTE — Plan of Care (Signed)
Problem: Loss of functioning (Thought Disorder, Mood Disturbance and/or Severe Anxiety) AS EVIDENCED BY...  Goal: Identify and participate in supportive program therapies  Outcome: Not Progressing  - Encouraged to attend group therapy, but, has not attended any yet this shift.      Problem: Mood Disorder  Goal: Reports improved mood  Outcome: Not Progressing  Flowsheets (Taken 09/21/2018 0312 by Herschel Senegal, RN)  Reports improved mood:   Encourage the patient to verbalize feelings of anxiety, anger, and fears   Frequent brief 1:1 conversations with the patient to assess mood /coping response  - Patient endorsed depression and increased anxiety.      Problem: Severe Anxiety  Goal: Verbalizes reduced anxiety  Outcome: Not Progressing  Flowsheets (Taken 09/21/2018 0312 by Herschel Senegal, RN)  Verbalizes reduced anxiety:   Assist to identify signs/symptoms of anxiety   Facilitate expression of feelings, fears, concerns, anxiety   Educate about stress reduction techniques and healthy coping responses  - Patient endorsed depression and increased anxiety.      Problem: Safety  Goal: Patient will be free from injury during hospitalization  Outcome: Progressing  - No falls.  - No aggressive behavior towards peers and staff.  - Patient remains safe in the unit.   Goal: Patient will be free from infection during hospitalization  Outcome: Progressing  - No s/s of infection noted.      Problem: Pain  Goal: Pain at adequate level as identified by patient  Outcome: Progressing  - Denies pain/discomfort.      Problem: Side Effects from Pain Analgesia  Goal: Patient will experience minimal side effects of analgesic therapy  Outcome: Progressing  - PRN analgesics not needed at this time.      Problem: Psychosocial and Spiritual Needs  Goal: Demonstrates ability to cope with hospitalization/illness  Outcome: Progressing  - Patient adjusting well to the unit.      Problem: At Risk for Suicide AS EVIDENCED  BY...  Goal: Will identify long and short term goals based on individual needs and strengths  Outcome: Progressing  - Goal for today is, "To keep the peace.".   Goal: Suicide Alert Level Low  Outcome: Progressing  - Denies SI/HI.     Problem: Addiction to alcohol or opioids or other substances AS EVIDENCED BY:  Goal: Patient achieves a safe detoxification and management of withdrawal symptoms  Outcome: Progressing  - Patient achieving a safe detoxification and management of withdrawal symptoms.          Patient presented with slowed behavior, depressed and anxious mood, blunted affect and has a guarded, yet, cooperative attitude. When asked what brought him to the hospital, patient stated, "I don't know, man.", then looked away from writer, somewhat irritable. Patient refused to answer much assessment questions with writer, but, spoke during Treatment Plan Meeting this morning, explaining what he plans to do and where to go once discharged.     Denies SI/HI/AVH, pain/discomfort, but endorsed depression and anxiety, refusing to elaborate any further. Writer told patient to approach any staff if feelings of depression or anxiety increases. Stayed mainly in his room, isolative to self. CIWA monitored. Medication compliant, but refused due Nicoderm CQ patch, stated, "I don't need the patch.". No s/s of SOB or distress noted.     Maintains safety in unit. Will continue to monitor every 15 minutes for safety and needs.

## 2018-09-21 NOTE — Plan of Care (Addendum)
Problem: Psychosocial and Spiritual Needs  Goal: Demonstrates ability to cope with hospitalization/illness  Outcome: Progressing  Flowsheets (Taken 09/21/2018 0312)  Demonstrates ability to cope with hospitalizations/illness:  . Encourage verbalization of feelings/concerns/expectations  . Encourage patient to set small goals for self  . Include patient/ patient care companion in decisions  . Provide quiet environment  . Encourage participation in diversional activity  . Communicate referral to spiritual care as appropriate  . Assist patient to identify own strengths and abilities  . Reinforce positive adaptation of new coping behaviors     Problem: Mood Disorder  Goal: Reports improved mood  Outcome: Progressing  Flowsheets (Taken 09/21/2018 0312)  Reports improved mood:  Marland Kitchen Encourage the patient to verbalize feelings of anxiety, anger, and fears  . Frequent brief 1:1 conversations with the patient to assess mood /coping response     Problem: Severe Anxiety  Goal: Verbalizes reduced anxiety  Outcome: Progressing  Flowsheets (Taken 09/21/2018 0312)  Verbalizes reduced anxiety:  . Assist to identify signs/symptoms of anxiety  . Facilitate expression of feelings, fears, concerns, anxiety  . Educate about stress reduction techniques and healthy coping responses    Note: Paul Pacheco presents with depressed mood, blunted affect, cooperative attitude. Pt was received watching TV and interacting with peers in the milieu. Currently denies si/hi/avh, pain/discomfort. Displays some insight into his illness/hospitalization. No withdrawal symptoms noted aeb COWS score of 0. Took and tolerated scheduled HS med's well. Hydration encouraged as his HR was initially low at 59, retaken was 62.  Monitored for safety q15 minutes.

## 2018-09-21 NOTE — Consults (Signed)
MEDICINE NEW CONSULT    Date Time: 09/21/18 3:54 PM  Patient Name: [facility]  Requesting Physician: Berneta Levins, MD  Consulting Physician: Lynnell Jude, ANP    Primary Care Physician: Marisa Sprinkles, MD    Reason for Consultation: medical assessment for psychiatric admission       Assessment:      Active Hospital Problems    Diagnosis   . Polysubstance abuse   . Suicidal ideation   . Mood disorder          Recommendations:   -Pt is medically stable for continued psychiatric management without restriction.     Reports hx of HLD and HTN but has not been taking meds due to frequent change of location.   -Agrees to check of lipid profile in AM. Atorvastatin ordered per old med list.   -Currently receiving Catapres as part of psychiatric withdrawal syndrome protocol. Monitor BP for need to initiate med therapy.     - No other medical problems requiring continued follow up.     Berneta Levins, MD, thank you for this consultation.  We will follow the patient with you as needed during this hospitalization.  Please contact the service with any questions or issues.    History of Presenting Illness:   Paul Pacheco is a 48 y.o. male with history of polysubstance abuse with cocaine, opioids, and ETOH was admitted to psychiatry for suicidal ideation.  Patient has been traveling to different parts of the country so has no consistent medical follow-up.  Reports history of hypertension and hyperlipidemia previously on medication.    Past Medical History:     Past Medical History:   Diagnosis Date   . Depression    . Hyperlipidemia    . Hypertension        Available old records reviewed, including: [place] EPIC records.     Past Surgical History:   History reviewed. No pertinent surgical history.    Family History:   History reviewed. No pertinent family history.    Social History:     Social History     Tobacco Use   Smoking Status Current Every Day Smoker   . Packs/day: 0.50   . Types: Cigarettes   Smokeless Tobacco  Never Used   Tobacco Comment    pt smoked within the past 30 days     Social History     Substance and Sexual Activity   Alcohol Use Yes   . Frequency: 4 or more times a week    Comment: pt had alcohol within the past 12 months     Social History     Substance and Sexual Activity   Drug Use Yes   . Types: Cocaine    Comment: pt had drugs within the past 12 months       Allergies:   No Known Allergies    Medications:   UNABLE TO VERIFY OUTPATIENT MEDICATIONS.     Medications Prior to Admission   Medication Sig   . amLODIPine (NORVASC) 10 MG tablet Take 10 mg by mouth   . atorvastatin (LIPITOR) 10 MG tablet Take 10 mg by mouth daily   . cloNIDine (CATAPRES) 0.1 MG tablet Take 0.1 mg by mouth   . risperiDONE (RisperDAL) 1 MG tablet Take 1 mg by mouth 2 (two) times daily   . sertraline (ZOLOFT) 50 MG tablet Take 50 mg by mouth daily   . traZODone (DESYREL) 100 MG tablet Take 100 mg by mouth nightly  Current Facility-Administered Medications   Medication Dose Route Frequency   . atorvastatin  10 mg Oral Daily   . folic acid  1 mg Oral Daily   . naltrexone  50 mg Oral Daily   . nicotine  1 patch Transdermal Daily   . risperiDONE  1 mg Oral BID   . sertraline  50 mg Oral Daily   . thiamine  100 mg Oral Daily   . traZODone  100 mg Oral QHS            Review of Systems:   Per PMH/HPI and below:     All other systems were reviewed and are negative. Reliability of history is limited by psychiatric issues.         Physical Exam:     Patient Vitals for the past 24 hrs:   BP Temp Temp src Pulse Resp SpO2 Weight   09/21/18 1152 133/86 97.5 F (36.4 C) Oral (!) 56 -- -- --   09/21/18 0759 129/67 97.5 F (36.4 C) Oral (!) 53 18 99 % --   09/20/18 2100 137/87 97.6 F (36.4 C) Oral 62 16 -- --   09/20/18 1954 132/69 97.7 F (36.5 C) Oral (!) 59 16 97 % --   09/20/18 1608 145/89 98.6 F (37 C) Oral 74 17 98 % 94.6 kg (208 lb 9.6 oz)     Body mass index is 25.39 kg/m.  No intake or output data in the 24 hours ending 09/21/18  1554  Physical Exam:   General appearance - alert,reasonably groomed AAM.   Mental status -oriented  X 4. Somewhat reluctant to answer questions but cooperative with interview and exam.   HEENT- Normocephalic, atraumatic, non-icteric with pink conjunctiva, moist mucous membranes.  Neck - supple   Chest - bilateral breath sounds clear to auscultation.  Heart -  regular rhythm, normal S1, S2.  Abdomen - soft, nontender.  Neurological - no focal deficets.  Musculoskeletal - normal gait. No gross deformities.  Extremities - peripheral pulses present,  no edema.  Skin - warm, dry, with no rashes, lesions or bruising.     Labs:     Recent Labs     09/20/18  0030   WBC 8.44   Hgb 13.5   Hematocrit 40.8   Platelets 209       Recent Labs     09/20/18  0030   Sodium 141   Potassium 4.0   Chloride 108   CO2 23   BUN 14.0   Creatinine 1.1   Glucose 107*   Calcium 8.8   Magnesium 2.3   Phosphorus 3.8   TSH 0.71       Recent Labs     09/20/18  0030   AST (SGOT) 22   ALT 22   Alkaline Phosphatase 97   Protein, Total 7.0   Albumin 3.6       BAL 150  Utox +cocaine  UA NA  COVID-19 Neg    Imaging personally reviewed, including:     ECG: SR with qTC .        Signed by: Lynnell Jude, ANP    cc: Berneta Levins, MD  Pcp, None, MD

## 2018-09-22 DIAGNOSIS — F331 Major depressive disorder, recurrent, moderate: Principal | ICD-10-CM

## 2018-09-22 DIAGNOSIS — F1910 Other psychoactive substance abuse, uncomplicated: Secondary | ICD-10-CM

## 2018-09-22 DIAGNOSIS — F1010 Alcohol abuse, uncomplicated: Secondary | ICD-10-CM

## 2018-09-22 DIAGNOSIS — Z759 Unspecified problem related to medical facilities and other health care: Secondary | ICD-10-CM

## 2018-09-22 DIAGNOSIS — Z609 Problem related to social environment, unspecified: Secondary | ICD-10-CM

## 2018-09-22 DIAGNOSIS — Z569 Unspecified problems related to employment: Secondary | ICD-10-CM

## 2018-09-22 DIAGNOSIS — Z559 Problems related to education and literacy, unspecified: Secondary | ICD-10-CM

## 2018-09-22 LAB — HEMOLYSIS INDEX: Hemolysis Index: 15 (ref 0–18)

## 2018-09-22 LAB — LIPID PANEL
Cholesterol / HDL Ratio: 6.2
Cholesterol: 236 mg/dL — ABNORMAL HIGH (ref 0–199)
HDL: 38 mg/dL — ABNORMAL LOW (ref 40–9999)
LDL Calculated: 160 mg/dL — ABNORMAL HIGH (ref 0–99)
Triglycerides: 189 mg/dL — ABNORMAL HIGH (ref 34–149)
VLDL Calculated: 38 mg/dL (ref 10–40)

## 2018-09-22 MED ORDER — AMLODIPINE BESYLATE 5 MG PO TABS
5.0000 mg | ORAL_TABLET | Freq: Every day | ORAL | Status: DC
Start: 2018-09-22 — End: 2018-09-23
  Administered 2018-09-22 – 2018-09-23 (×2): 5 mg via ORAL
  Filled 2018-09-22 (×2): qty 1

## 2018-09-22 MED ORDER — RISPERIDONE 1 MG PO TABS
2.0000 mg | ORAL_TABLET | Freq: Every evening | ORAL | Status: DC
Start: 2018-09-22 — End: 2018-09-23
  Administered 2018-09-22: 22:00:00 2 mg via ORAL
  Filled 2018-09-22: qty 2

## 2018-09-22 NOTE — Plan of Care (Signed)
Problem: Loss of functioning (Thought Disorder, Mood Disturbance and/or Severe Anxiety) AS EVIDENCED BY...  Goal: Identify and participate in supportive program therapies  Outcome: Not Progressing  Flowsheets (Taken 09/22/2018 1838)  Identify and participate in supportive program therapies:   Identify target number of therapies patient will attend daily as appropriate to functioning level   Encourage attendance and reinforce small successes in participation  Note: Therapist checked in with the patient 1:1 regarding patient's mood, goals for the day, and to educate the patent on different aspects of the day's routine and unit rules.  Patient was told the groups for the day, encouraged to reach out for 1:1's as needed, and was given unit rules, specifically emphasizing ones pertaining to the corona virus. Patient has not attended any groups thus far today and has rarely been seen outside the patient's room.

## 2018-09-22 NOTE — Plan of Care (Signed)
Problem: Psychosocial and Spiritual Needs  Goal: Demonstrates ability to cope with hospitalization/illness  Outcome: Not Progressing  Flowsheets (Taken 09/21/2018 0312)  Demonstrates ability to cope with hospitalizations/illness:  . Encourage verbalization of feelings/concerns/expectations  . Encourage patient to set small goals for self  . Include patient/ patient care companion in decisions  . Provide quiet environment  . Encourage participation in diversional activity  . Communicate referral to spiritual care as appropriate  . Assist patient to identify own strengths and abilities  . Reinforce positive adaptation of new coping behaviors     Problem: Mood Disorder  Goal: Reports improved mood  Outcome: Not Progressing  Flowsheets (Taken 09/21/2018 0312)  Reports improved mood:  Marland Kitchen Encourage the patient to verbalize feelings of anxiety, anger, and fears  . Frequent brief 1:1 conversations with the patient to assess mood /coping response     Problem: Severe Anxiety  Goal: Verbalizes reduced anxiety  Outcome: Progressing  Flowsheets (Taken 09/21/2018 0312)  Verbalizes reduced anxiety:  . Assist to identify signs/symptoms of anxiety  . Facilitate expression of feelings, fears, concerns, anxiety  . Educate about stress reduction techniques and healthy coping responses     Problem: Addiction to alcohol or opioids or other substances AS EVIDENCED BY:  Goal: Patient achieves a safe detoxification and management of withdrawal symptoms  Outcome: Progressing  Flowsheets (Taken 09/21/2018 2356)  Patient achieves a safe detoxification and management of withdrawal symptoms:  . Assess withdrawal signs/symptoms according to identified protocol (e.g. CIWA/COWS)  . Provide medication teaching including name, dosage, benefits, action, effect and side effects  . Assess effectiveness (relief of withdrawal symptoms) and side effects of medication  . Ensure laboratory results are reviewed with the LIP to increase understanding of the medical  consequences of addiction  . Ensure adequate hydration and nutrition during withdrawal  . Educate about the importance of reporting dizziness or unsteadiness while ambulating to care providers (e.g. RN, LIP)  . Educate about health risks associated with withdrawal (e.g. seizures, Dts)    Note: Paul Pacheco was isolated to self in his room. Pt was later seen watching TV in the milieu, no peer interaction noted. Compliant with med's. Depressed, blunted, guarded attitude. Plans to go to [facility] after discharge to get into a substance abuse program. No verbalization of si/hi/avh, pain/discomfort, anxiety. Hydrating adequately. CIWA- 0. Monitored for safety q15 minutes.

## 2018-09-22 NOTE — Progress Notes (Signed)
PSYCHIATRY INPATIENT PROGRESS NOTE    Date/Time: 09/22/18 10:53 AM  Patient's Name: Paul Pacheco, Paul Pacheco  MR#:: [id number]  DOB: 1971-02-09      Medications:   Current medications and doses:    Current Facility-Administered Medications   Medication Dose Route Frequency Provider Last Rate Last Dose   . alum & mag hydroxide-simethicone (MAALOX PLUS) 200-200-20 mg/5 mL suspension 30 mL  30 mL Oral Q6H PRN Berneta Levins, MD       . atorvastatin (LIPITOR) tablet 10 mg  10 mg Oral Daily Milda Smart, NP   10 mg at 09/22/18 0902   . cloNIDine (CATAPRES) tablet 0.1 mg  0.1 mg Oral 4X Daily PRN Berneta Levins, MD       . diphenhydrAMINE (BENADRYL) capsule 50 mg  50 mg Oral Once PRN Berneta Levins, MD        Or   . diphenhydrAMINE (BENADRYL) injection 50 mg  50 mg Intramuscular Once PRN Berneta Levins, MD       . docusate sodium (COLACE) capsule 100 mg  100 mg Oral BID PRN Berneta Levins, MD       . folic acid (FOLVITE) tablet 1 mg  1 mg Oral Daily Berneta Levins, MD   1 mg at 09/22/18 0902   . hydrALAZINE (APRESOLINE) tablet 25 mg  25 mg Oral Q8H PRN Randal Buba, MD       . hydrOXYzine (VISTARIL) capsule 25 mg  25 mg Oral Q4H PRN Berneta Levins, MD       . ibuprofen (ADVIL) tablet 400 mg  400 mg Oral Q6H PRN Berneta Levins, MD       . loperamide (IMODIUM) capsule 2 mg  2 mg Oral Q3H PRN Berneta Levins, MD       . ziprasidone (GEODON) injection 20 mg  20 mg Intramuscular TID PRN Berneta Levins, MD        And   . LORazepam (ATIVAN) injection 2 mg  2 mg Intramuscular TID PRN Berneta Levins, MD       . LORazepam (ATIVAN) tablet 1 mg  1 mg Oral Q4H PRN Berneta Levins, MD       . naltrexone (REVIA) tablet 50 mg  50 mg Oral Daily Berneta Levins, MD   50 mg at 09/22/18 0902   . nicotine (NICODERM CQ) 21 MG/24HR patch 1 patch  1 patch Transdermal Daily Berneta Levins, MD       . nicotine polacrilex (NICORETTE) gum 2 mg  2 mg Oral Q2H PRN Berneta Levins, MD   2 mg at 09/20/18 1259   . OLANZapine (ZyPREXA) tablet 10 mg   10 mg Oral BID PRN Berneta Levins, MD       . ondansetron [facility]) tablet 4 mg  4 mg Oral Q8H PRN Berneta Levins, MD       . QUEtiapine (SEROquel) tablet 50 mg  50 mg Oral QHS PRN Berneta Levins, MD       . risperiDONE (RisperDAL) tablet 1 mg  1 mg Oral BID Milda Smart, NP   1 mg at 09/22/18 0902   . sertraline (ZOLOFT) tablet 50 mg  50 mg Oral Daily Milda Smart, NP   50 mg at 09/22/18 5409   . thiamine (VITAMIN B1) tablet 100 mg  100 mg Oral Daily Berneta Levins, MD   100 mg at 09/22/18 0902   . traZODone (DESYREL) tablet 100 mg  100 mg Oral QHS Milda Smart, NP   100 mg at 09/21/18 2109  Legal Status:   Voluntary    Subjective Findings:      Symptoms: Depression, anxiety, sedated   Medication Side Effects: Sedated   Collateral History: Patient, chart   Family Involvement: None   Psychosocial Functioning: Isolative   Attending Groups: None    Objective Findings:   Patient is a 48 year old African-American male with decent hygiene, poor eye contact, good posture    Mental Status Evaluation:     Appearance:  age appropriate   Behavior:  normal   Speech:  normal volume   Mood:  mildly sedate    Affect:  flat   Thought Process:  circumstantial   Thought Content:  normal   Sensorium:  person, place, time/date and situation   Cognition:  grossly intact   Insight:  limited   Judgment:  limited       Neuro Vegetative Functions:      Energy: Poor, unable to carry out most of his/her usual activities.   Sleep: Slept heavily    Appetite: Appetite is reported to be intact, with no significant changes in weight.    Vital Signs:     Vitals:    09/22/18 0811   BP: 146/87   Pulse: (!) 53   Resp: 18   Temp: 97.9 F (36.6 C)   SpO2: 100%        Laboratory Assessment:   No results found for this or any previous visit (from the past 24 hour(s)).    Microbiology: none      Assessment:   Clinical formulation:  Patient is a 48 y.o. African American male presents with reported suicidal ideation with a  plan to overdose using over-the-counter medications.  Patient seems to be indigent as he has been moving from locations across the [place].  As he reports being from [place] originally, then he moved to [place] and lived there for a great time then he has slowly migrated [employer] [place] with multiple scabs for extended period of times in [place], [place], "the [address]     She reports that his depression is improving.  His anxiety is maintaining.  He is mildly to moderately sedated as he reports the morning Risperdal is causing him to be drowsy.  He reports that his mood is mostly stable without any evidence of mania.  He denies any paranoia or delusional thought processes.  Patient has been maintaining his ADLs and hydration/nutrition.  He denies any homicidal or suicidal ideations.  He denies any current detox symptoms from substances.  He denies any urges or cravings to use substances.  Though patient is fairly sedate.  Patient will change his Risperdal to 2 mg orally at bedtime and not to give him the morning dose.  Patient may be ready for discharge either tomorrow or the next day.    Diagnoses:      Axis I: Polysubstance abuse abuse, Alcohol abuse, Cannabis abuse, Cocaine abuse, GAD (generalized anxiety disorder) and Major depressive disorder, recurrent episode, moderate   Axis ZO:XWRUE   Axis III: See problem list in the medical record   Axis IV: Educational problems   Occupational problems   Problems related to social environment   Problems with access to health care services   Axis V:51-60: moderate symptoms.: Highest in the last year 90.    Plan:     Medication Plan:     Zoloft 50 mg orally once a day  Change Risperdal 1 mg orally qhs  trazodone 100 mg orally at bedtime  naltrexone 50 mg orally once a day       Medication Education: Side effect profile    Medical work-up plan/testing:  Complete    Plan for Family Involvement:   None    Aftercare Plan: Patient plans on traveling to [place].    Ongoing hospitalization required for medication management    Other Providers Contact Information and Dates Contacted: None    On this admission patient educated about and provided input into their treatment plan.  Patient understands potential risks and benefits of proposed treatment plan. -    Expected length of stay: 3 to 10 days  Total floor time: 35 minutes  At least 50% in coordination of care and counseling:Yes        Signed by: Milda Smart, MD   09/22/18 10:53 AM

## 2018-09-22 NOTE — Plan of Care (Signed)
Problem: At Risk for Suicide AS EVIDENCED BY...  Goal: Will identify long and short term goals based on individual needs and strengths  Outcome: Not Progressing  Flowsheets (Taken 09/22/2018 1708)  Pt will identify long and short term treatment goals based on individual needs and strengths:   Assist to identify goals for treatment based on individual needs and strengths   Assist patient to define criteria for discharge   Assist patient to sign acknowledgement of treatment plan participation  Note: Cayle was unable to identify any goal for the shift. Staff will continue to encourage for group attendance.     Problem: Loss of functioning (Thought Disorder, Mood Disturbance and/or Severe Anxiety) AS EVIDENCED BY...  Goal: Identify and participate in supportive program therapies  Outcome: Not Progressing  Flowsheets (Taken 09/22/2018 1708)  Identify and participate in supportive program therapies:   Identify target number of therapies patient will attend daily as appropriate to functioning level   Encourage attendance and reinforce small successes in participation  Note: Omarri has not attended any group so far.      Problem: At Risk for Suicide AS EVIDENCED BY...  Goal: Suicide Alert Level Low  Outcome: Progressing  Flowsheets (Taken 09/22/2018 1708)  Suicide Alert Level Low:   Every 15 minute check   Reassess patient every shift using Suicide Assessment Tool (SAT)  Note: Krishang denies SI/HI. He is monitored on Q 15 minute checks for safety.     Problem: Addiction to alcohol or opioids or other substances AS EVIDENCED BY:  Goal: Patient achieves a safe detoxification and management of withdrawal symptoms  Outcome: Progressing  Flowsheets (Taken 09/22/2018 1708)  Patient achieves a safe detoxification and management of withdrawal symptoms:   Assess withdrawal signs/symptoms according to identified protocol (e.g. CIWA/COWS)   Assess effectiveness (relief of withdrawal symptoms) and side effects of medication   Educate  about health risks associated with withdrawal (e.g. seizures, DTs)   Ensure adequate hydration and nutrition during withdrawal   Educate about the importance of reporting dizziness or unsteadiness while ambulating to care providers (e.g. RN, LIP)  Note: Sergei is on CIWA protocol for alcohol withdrawal.    Future was received in his room at the beginning of shift. He denies SI/HI/AVH, pain or discomfort. Pt has been in his room majority of the shift. He was briefly out in the milieu watching T.V with peers but isolated to self. He presented with a slowed behavior, depressed mood, blunted affect, guarded/cooperative attitude. He accepted his scheduled medicine with no difficulty. Safety maintained.

## 2018-09-22 NOTE — Progress Notes (Addendum)
MEDICINE PROGRESS NOTE  [facility], DIVISION OF HOSPITALIST MEDICINE   [facility]   [place] Pager: [phone]      Date Time: 09/22/18 1:08 PM  Patient Name: Paul Pacheco  Attending Physician: Berneta Levins, MD  Hospital Day: 3  Assessment:     Active Hospital Problems    Diagnosis   . Polysubstance abuse   . Suicidal ideation   . Mood disorder       Plan:   HTN  -will add norvasc 5mg  daily, cont PRN hydralazine    Hypercholesterolemia  -cont statin    Remainder of care per primary service/psychiatry    If bp stable, then no further changes and will sign off    Subjective     CC:HTN  HPI/Subjective: Pt seen and examined.  Pt feeling okay, no pain or SOB.    Review of Systems:   Review of Systems - Negative except as above in HPI    Physical Exam:     Temp:  [97.3 F (36.3 C)-98.1 F (36.7 C)] 97.9 F (36.6 C)  Heart Rate:  [51-67] 53  Resp Rate:  [17-18] 18  BP: (126-149)/(78-87) 146/87  No intake or output data in the 24 hours ending 09/22/18 1308 General: alert and awake, no distress  HEENT: anicteric, moist mucous membranes  Neck: supple, no JVD  CVS: nl S1, S2; no gallop or rub  Lungs: clear to auscultation b/l, no wheezing or rhonchi; normal resp effort  Ext: no pedal edema, no cyanosis       Meds:   Medications were reviewed:  Scheduled Meds:  Current Facility-Administered Medications   Medication Dose Route Frequency   . atorvastatin  10 mg Oral Daily   . folic acid  1 mg Oral Daily   . naltrexone  50 mg Oral Daily   . nicotine  1 patch Transdermal Daily   . risperiDONE  2 mg Oral QHS   . sertraline  50 mg Oral Daily   . thiamine  100 mg Oral Daily   . traZODone  100 mg Oral QHS     Continuous Infusions:  PRN Meds:.alum & mag hydroxide-simethicone, cloNIDine, diphenhydrAMINE **OR** diphenhydrAMINE, docusate sodium, hydrALAZINE, hydrOXYzine, ibuprofen, loperamide, ziprasidone **AND** LORazepam, LORazepam, nicotine polacrilex, OLANZapine, ondansetron, QUEtiapine  Labs/Radiology:    Imaging personally reviewed, including: all available   No results found.  No results for input(s): GLUCOSEWB in the last 24 hours.  Recent Labs   Lab 09/20/18  0030   Sodium 141   Potassium 4.0   Chloride 108   BUN 14.0   Creatinine 1.1   EGFR >60.0   Glucose 107*   Calcium 8.8     Recent Labs   Lab 09/20/18  0030   WBC 8.44   Hgb 13.5   Hematocrit 40.8   Platelets 209         Recent Labs   Lab 09/20/18  0030   Alkaline Phosphatase 97   Bilirubin, Total 0.4   ALT 22   AST (SGOT) 22       This note was generated by the Epic EMR system/ Dragon speech recognition and may contain inherent errors or omissions not intended by the user. Grammatical errors, random word insertions, deletions and pronoun errors  are occasional consequences of this technology due to software limitations. Not all errors are caught or corrected. If there are questions or concerns about the content of this note or information contained within the body of this dictation they  should be addressed directly with the author for clarification.    Signed by: Randal Buba, MD

## 2018-09-23 MED ORDER — NICOTINE 21 MG/24HR TD PT24
1.00 | MEDICATED_PATCH | Freq: Every day | TRANSDERMAL | 0 refills | Status: AC
Start: 2018-09-24 — End: ?

## 2018-09-23 MED ORDER — AMLODIPINE BESYLATE 5 MG PO TABS
5.00 mg | ORAL_TABLET | Freq: Every day | ORAL | 1 refills | Status: AC
Start: 2018-09-24 — End: ?

## 2018-09-23 MED ORDER — NALTREXONE HCL 50 MG PO TABS
50.00 mg | ORAL_TABLET | Freq: Every day | ORAL | 1 refills | Status: AC
Start: 2018-09-24 — End: ?

## 2018-09-23 MED ORDER — RISPERIDONE 2 MG PO TABS
2.00 mg | ORAL_TABLET | Freq: Every evening | ORAL | 1 refills | Status: AC
Start: 2018-09-23 — End: ?

## 2018-09-23 NOTE — Plan of Care (Signed)
Problem: Mood Disorder  Goal: Reports improved mood  Outcome: Progressing     Problem: Severe Anxiety  Goal: Verbalizes reduced anxiety  Outcome: Progressing   Received pt in the common area watching tv with peers but keep to himself. He reported that he slept most of the day and did not attend group. Pt is encourage to attend groups to identify coping skills to manage current situation. Pt verbalized understanding. Affect blunted, mood depressed, insight/judgement fair, speech clear and coherent, fleeting eye contact. Pt is eating and hydrating adequately. No unsafe behavior observed. Pt agreed to come to staff when feeling unsafe. He is calm, cooperative and medication compliant.

## 2018-09-23 NOTE — Plan of Care (Signed)
Patient has not attended any groups thus far today, but has been visible in the milieu watching tv with peers.  Will continue to encourage group attendance and participation as well as 1:1 with therapist, in order to increase positive coping skills and express feelings regarding current situation.

## 2018-09-23 NOTE — Plan of Care (Signed)
Problem: Safety  Goal: Patient will be free from injury during hospitalization  Outcome: Adequate for Discharge  Goal: Patient will be free from infection during hospitalization  Outcome: Adequate for Discharge     Problem: Pain  Goal: Pain at adequate level as identified by patient  Outcome: Adequate for Discharge     Problem: Side Effects from Pain Analgesia  Goal: Patient will experience minimal side effects of analgesic therapy  Outcome: Adequate for Discharge     Problem: Discharge Barriers  Goal: Patient will be discharged home or other facility with appropriate resources  Outcome: Adequate for Discharge     Problem: Psychosocial and Spiritual Needs  Goal: Demonstrates ability to cope with hospitalization/illness  Outcome: Adequate for Discharge     Problem: At Risk for Suicide AS EVIDENCED BY...  Goal: Will identify long and short term goals based on individual needs and strengths  Outcome: Adequate for Discharge  Goal: Suicide Alert Level Low  Outcome: Adequate for Discharge     Problem: Loss of functioning (Thought Disorder, Mood Disturbance and/or Severe Anxiety) AS EVIDENCED BY...  Goal: Identify and participate in supportive program therapies  Outcome: Adequate for Discharge     Problem: Mood Disorder  Goal: Reports improved mood  Outcome: Adequate for Discharge     Problem: Severe Anxiety  Goal: Verbalizes reduced anxiety  Outcome: Adequate for Discharge     Problem: Addiction to alcohol or opioids or other substances AS EVIDENCED BY:  Goal: Patient achieves a safe detoxification and management of withdrawal symptoms  Outcome: Adequate for Discharge    Pt is discharged.

## 2018-09-23 NOTE — Progress Notes (Signed)
Discharge 09/23/18: Discharge order given. No visible distress or psychotic episode noted prior to discharge. Denied any discomfort including pain, SI/HI/AVH. Instructions provided along with appointments and medications explained to pt. Pt verbalized understanding. Belonging returned to pt and completed inpatient survey and discharge safety plan.  Pt d/c to home accompanied by staff to cab around 1515.

## 2018-09-23 NOTE — Discharge Summary (Signed)
PSYCHIATRY DISCHARGE SUMMARY  AND POST DISCHARGE CONTINUING CARE PLAN    Date/Time: 09/23/2018 12:13 PM  Patient Name: Paul Pacheco, Paul Pacheco  MRN#: [id number]  Age: 48 y.o.   Date of Birth: 01-02-1971    Date of Admission:   09/20/2018  Date of Discharge:     September 23, 2018  Admitting Physician:    Janace Aris, MD  Discharge Physician:    Milda Smart, NP     Event leading to hospitalization / Reason for Hospitalization   Chief Complaint or Reason for Admission     Legal Status on admission was voluntary.    Discharge Diagnoses:       Principal Discharge Diagnosis: Polysubstance abuse      Other  Psychiatric Diagnoses  Axis I: Polysubstance abuse abuse, Alcohol abuse, Cannabis abuse, Cocaine abuse, GAD (generalized anxiety disorder) and Major depressive disorder, recurrent episode, moderate  Axis VW:UJWJX  Axis III: See problem list in the medical record  Axis IV: Educational problems  Occupational problems  Problems related to social environment  Problems with access to health care services      Axis V:  GAF at  Admission: 31-40: impairment in reality testing.          GAF at Discharge:71-80: transient mild symptoms or brief and mild impairment.    Comorbid Conditions: Alcohol use Disorder - current or past, Cannabis use Disorder - current or past, Housing -unstable or none and Medication noncompliance    Labs/Scans/EKG     Results     Procedure Component Value Units Date/Time    Hemolysis index [[id number]] Collected:  09/22/18 1000     Updated:  09/22/18 1713     Hemolysis Index 15    Lipid panel [[id number]]  (Abnormal) Collected:  09/22/18 1000    Specimen:  Blood Updated:  09/22/18 1713     Cholesterol 236 mg/dL      Triglycerides 469 mg/dL      HDL 38 mg/dL      LDL Calculated 629 mg/dL      VLDL Calculated 38 mg/dL      Cholesterol / HDL Ratio 6.2        No results found for any visits on 09/20/18.  Cardiology Results     None          Consults:     Consult Orders (From admission, onward)    None         None.    Discharge Day Evaluation     Blood pressure 114/78, pulse 65, temperature 97 F (36.1 C), temperature source Oral, resp. rate 16, weight 94.6 kg (208 lb 9.6 oz), SpO2 97 %.    Mental Status Exam    General appearance: Appears chronological age  Attitude/Behavior: Calm  Motor: No abnormalities noted  Gait: No obvious abnormalities  Muscle strength and tone: Grossly intact  Speech:   Spontaneous: Yes  Mood: Euthymic  Affect:   Range: Full  Thought Process:   Coherent: Yes  Logical:  Yes  Associations: Circumstantial  Thought Content:   Suicidal:  No suicidal thoughts  Homicidal:  No homicidal thoughts  Perceptions:   Dissociative Phenomena:  No  Hallucinations: No  Illusions:  No  Insight: Good  Judgment: Good  Cognition:       Level of Consciousness: Intact    Review of Systems  A complete 14 point ROS was done and was negative except for  Psychiatric: Depression and Anxiety  Constitutional: No complaints  Constitutional: No  complaints    Hospital Course:   Patient was admitted to the hospital following a few day binge on cocaine and alcohol, increase in depression, compliance with medicines, and increased suicidal ideation.  Patient also reported that he was having some hallucinations that were primarily substance-induced.  Patient is very vague on most of his answers for his psychiatric assessment.  He responded well to reintroduction of his Risperdal, Zoloft, and naltrexone.  The only side effect he experienced with the Risperdal was mild sedation in the morning which could cause some difficulty for functioning..  Patient had his dose changed to only an evening dose.  Which responded well.  He requested discharge so he could continue traveling to [place].  Patient is planning on finding his own psychiatric care there when he arrives.    Suicidal and Homicidal Status on Discharge:   Patient denies suicidal and homicidal ideation, intent and plan.    Discharge Instructions:   Discharge Disposition:  Home - Self Care    Discharge Instructions given to: Patient    Questions that may arise between hospital discharge and your first follow-up appointment should be directed to [facility]: [phone]; 911 or the closest emergency department    Case discussion was held between inpatient treatment team and outpatient provider(s).  Discharge Plan:     Follow-up Information     Pcp, None, MD .                 Attestation:   The patient has been seen and evaluated by me,  Milda Smart, NP    Patient discharged on more than 1 antipsychotic medication? No  On discharge, was the patient on any psychotropic medication off label? No  I spent at least 35 minutes coordinating the discharge and reviewing the discharge plan.    Discharge Medications:     Tobacco Cessation Discharge Prescription for Cessation Medication  Patient was assessed on admission and found to be a tobacco user.  He was offered and accepted tobacco cessation medication prescription for Nicotine 21mg /24hr patch upon discharge.       Discharge Medication List      Taking    amLODIPine 5 MG tablet  Dose:  5 mg  What changed:     medication strength   how much to take   when to take this  Commonly known as:  NORVASC  For:  Chronic Angina Following Exercise, Stress, or Excitement  Start taking on:  September 24, 2018  Take 1 tablet (5 mg total) by mouth daily     naltrexone 50 MG tablet  Dose:  50 mg  Commonly known as:  REVIA  For:  Abuse or Misuse of Alcohol  Start taking on:  September 24, 2018  Take 1 tablet (50 mg total) by mouth daily     nicotine 21 MG/24HR  Dose:  1 patch  Commonly known as:  NICODERM CQ  For:  Nicotine Addiction  Start taking on:  September 24, 2018  Place 1 patch onto the skin daily     risperiDONE 2 MG tablet  Dose:  2 mg  What changed:     medication strength   how much to take   when to take this  Commonly known as:  RisperDAL  For:  Major Depressive Disorder  Take 1 tablet (2 mg total) by  mouth nightly        STOP taking these medications    atorvastatin 10 MG  tablet  Commonly known as:  LIPITOR     cloNIDine 0.1 MG tablet  Commonly known as:  CATAPRES     sertraline 50 MG tablet  Commonly known as:  ZOLOFT     traZODone 100 MG tablet  Commonly known as:  DESYREL              Signed by: Milda Smart, NP   09/23/2018  12:13 PM

## 2019-06-05 ENCOUNTER — Inpatient Hospital Stay
Admit: 2019-06-05 | Discharge: 2019-06-05 | Disposition: A | Discharge status: Home / Self Care | Attending: Emergency Medicine

## 2019-06-05 DIAGNOSIS — F1029 Alcohol dependence with unspecified alcohol-induced disorder: Secondary | ICD-10-CM

## 2019-06-05 LAB — DRUG SCREEN, URINE
AMPHETAMINES: NEGATIVE
Amphetamine Screen, Urine: NEGATIVE
BARBITURATES: NEGATIVE
BENZODIAZEPINES: NEGATIVE
Barbiturate Screen, Urine: NEGATIVE
Benzodiazepine Screen, Urine: NEGATIVE
COCAINE: POSITIVE
Cocaine Screen Urine: POSITIVE
METHADONE: NEGATIVE
Methadone Screen, Urine: NEGATIVE
OPIATES: NEGATIVE
Opiate Screen, Urine: NEGATIVE
PCP Screen, Urine: NEGATIVE
PCP(PHENCYCLIDINE): NEGATIVE
THC (TH-CANNABINOL): NEGATIVE
THC Screen, Urine: NEGATIVE

## 2019-06-05 LAB — COMPREHENSIVE METABOLIC PANEL
ALT: 46 U/L (ref 12–65)
AST: 32 U/L (ref 15–37)
Albumin/Globulin Ratio: 0.9 — ABNORMAL LOW (ref 1.2–3.5)
Albumin: 3.4 g/dL — ABNORMAL LOW (ref 3.5–5.0)
Alkaline Phosphatase: 111 U/L (ref 50–136)
Anion Gap: 7 mmol/L (ref 7–16)
BUN: 19 MG/DL (ref 6–23)
CO2: 29 mmol/L (ref 21–32)
Calcium: 9 MG/DL (ref 8.3–10.4)
Chloride: 108 mmol/L — ABNORMAL HIGH (ref 98–107)
Creatinine: 1.32 MG/DL (ref 0.8–1.5)
EGFR IF NonAfrican American: >60 mL/min/{1.73_m2} (ref >60)
GFR African American: >60 mL/min/{1.73_m2} (ref >60)
Globulin: 3.6 g/dL — ABNORMAL HIGH (ref 2.3–3.5)
Glucose: 88 mg/dL (ref 65–100)
Potassium: 3.8 mmol/L (ref 3.5–5.1)
Sodium: 144 mmol/L (ref 136–145)
Total Bilirubin: 0.3 MG/DL (ref 0.2–1.1)
Total Protein: 7 g/dL (ref 6.3–8.2)

## 2019-06-05 LAB — CBC WITH AUTO DIFFERENTIAL
Basophils %: 0 % (ref 0.0–2.0)
Basophils Absolute: 0 10*3/uL (ref 0.0–0.2)
Eosinophils %: 1 % (ref 0.5–7.8)
Eosinophils Absolute: 0.1 10*3/uL (ref 0.0–0.8)
Granulocyte Absolute Count: 0 10*3/uL (ref 0.0–0.5)
Hematocrit: 44.7 % (ref 41.1–50.3)
Hemoglobin: 14.9 g/dL (ref 13.6–17.2)
Immature Granulocytes: 0 % (ref 0.0–5.0)
Lymphocytes %: 23 % (ref 13–44)
Lymphocytes Absolute: 2.5 10*3/uL (ref 0.5–4.6)
MCH: 30.6 PG (ref 26.1–32.9)
MCHC: 33.3 g/dL (ref 31.4–35.0)
MCV: 91.8 FL (ref 79.6–97.8)
MPV: 10.9 FL (ref 9.4–12.3)
Monocytes %: 7 % (ref 4.0–12.0)
Monocytes Absolute: 0.8 10*3/uL (ref 0.1–1.3)
NRBC Absolute: 0 10*3/uL (ref 0.0–0.2)
Neutrophils %: 68 % (ref 43–78)
Neutrophils Absolute: 7.2 10*3/uL (ref 1.7–8.2)
Platelets: 199 10*3/uL (ref 150–450)
RBC: 4.87 M/uL (ref 4.23–5.6)
RDW: 13 % (ref 11.9–14.6)
WBC: 10.6 10*3/uL (ref 4.3–11.1)

## 2019-06-05 LAB — ETHYL ALCOHOL
ALCOHOL(ETHYL),SERUM: 77 MG/DL
Ethyl Alcohol: 77 MG/DL

## 2019-06-05 LAB — CBC WITH AUTOMATED DIFF
ABS. BASOPHILS: 0 10*3/uL (ref 0.0–0.2)
ABS. EOSINOPHILS: 0.1 10*3/uL (ref 0.0–0.8)
ABS. IMM. GRANS.: 0 10*3/uL (ref 0.0–0.5)
ABS. LYMPHOCYTES: 2.5 10*3/uL (ref 0.5–4.6)
ABS. MONOCYTES: 0.8 10*3/uL (ref 0.1–1.3)
ABS. NEUTROPHILS: 7.2 10*3/uL (ref 1.7–8.2)
ABSOLUTE NRBC: 0 10*3/uL (ref 0.0–0.2)
BASOPHILS: 0 % (ref 0.0–2.0)
EOSINOPHILS: 1 % (ref 0.5–7.8)
HCT: 44.7 % (ref 41.1–50.3)
HGB: 14.9 g/dL (ref 13.6–17.2)
IMMATURE GRANULOCYTES: 0 % (ref 0.0–5.0)
LYMPHOCYTES: 23 % (ref 13–44)
MCH: 30.6 PG (ref 26.1–32.9)
MCHC: 33.3 g/dL (ref 31.4–35.0)
MCV: 91.8 FL (ref 79.6–97.8)
MONOCYTES: 7 % (ref 4.0–12.0)
MPV: 10.9 FL (ref 9.4–12.3)
NEUTROPHILS: 68 % (ref 43–78)
PLATELET: 199 10*3/uL (ref 150–450)
RBC: 4.87 M/uL (ref 4.23–5.6)
RDW: 13 % (ref 11.9–14.6)
WBC: 10.6 10*3/uL (ref 4.3–11.1)

## 2019-06-05 LAB — METABOLIC PANEL, COMPREHENSIVE
A-G Ratio: 0.9 — ABNORMAL LOW (ref 1.2–3.5)
ALT (SGPT): 46 U/L (ref 12–65)
AST (SGOT): 32 U/L (ref 15–37)
Albumin: 3.4 g/dL — ABNORMAL LOW (ref 3.5–5.0)
Alk. phosphatase: 111 U/L (ref 50–136)
Anion gap: 7 mmol/L (ref 7–16)
BUN: 19 MG/DL (ref 6–23)
Bilirubin, total: 0.3 MG/DL (ref 0.2–1.1)
CO2: 29 mmol/L (ref 21–32)
Calcium: 9 MG/DL (ref 8.3–10.4)
Chloride: 108 mmol/L — ABNORMAL HIGH (ref 98–107)
Creatinine: 1.32 MG/DL (ref 0.8–1.5)
GFR est AA: >60 mL/min/{1.73_m2} (ref >60)
GFR est non-AA: >60 mL/min/{1.73_m2} (ref >60)
Globulin: 3.6 g/dL — ABNORMAL HIGH (ref 2.3–3.5)
Glucose: 88 mg/dL (ref 65–100)
Potassium: 3.8 mmol/L (ref 3.5–5.1)
Protein, total: 7 g/dL (ref 6.3–8.2)
Sodium: 144 mmol/L (ref 136–145)

## 2019-06-05 NOTE — ED Provider Notes (Signed)
49-year-old black male with a history of bipolar disorder, schizophrenia, hypertension, hypercholesterolemia presents to the emergency department complaining of needing help with his cocaine and alcohol injections.  Patient states he has been here about a month, currently homeless and without funding.  Patient states he cannot recall what blood pressure medicine he was on, and states he was on trazodone and Risperdal.  Patient states he was treated a little over a year ago at the [facility] with good results for at least some time.  Patient does admit to using both cocaine and alcohol this evening.  Patient denies SI or HI    The history is provided by the patient.        No past medical history on file.    No past surgical history on file.      No family history on file.    Social History     Socioeconomic History   ??? Marital status: SINGLE     Spouse name: Not on file   ??? Number of children: Not on file   ??? Years of education: Not on file   ??? Highest education level: Not on file   Occupational History   ??? Not on file   Social Needs   ??? Financial resource strain: Not on file   ??? Food insecurity     Worry: Not on file     Inability: Not on file   ??? Transportation needs     Medical: Not on file     Non-medical: Not on file   Tobacco Use   ??? Smoking status: Current Every Day Smoker   ??? Smokeless tobacco: Never Used   Substance and Sexual Activity   ??? Alcohol use: Yes   ??? Drug use: Yes     Types: Cocaine   ??? Sexual activity: Not on file   Lifestyle   ??? Physical activity     Days per week: Not on file     Minutes per session: Not on file   ??? Stress: Not on file   Relationships   ??? Social connections     Talks on phone: Not on file     Gets together: Not on file     Attends religious service: Not on file     Active member of club or organization: Not on file     Attends meetings of clubs or organizations: Not on file     Relationship status: Not on file   ??? Intimate partner violence     Fear of current or ex partner:  Not on file     Emotionally abused: Not on file     Physically abused: Not on file     Forced sexual activity: Not on file   Other Topics Concern   ??? Not on file   Social History Narrative   ??? Not on file         ALLERGIES: Patient has no known allergies.    Review of Systems   Constitutional: Negative for chills and fever.   Psychiatric/Behavioral: Positive for dysphoric mood and sleep disturbance. Negative for hallucinations and suicidal ideas.   All other systems reviewed and are negative.      Vitals:    06/05/19 0205   BP: (!) 155/94   Pulse: 84   Resp: 18   Temp: 98.2 ??F (36.8 ??C)   SpO2: 98%   Weight: 113.4 kg (250 lb)   Height: 6' 5" (1.956 m)              Physical Exam  Vitals signs and nursing note reviewed.   Constitutional:       General: He is not in acute distress.     Appearance: He is well-developed.   HENT:      Head: Normocephalic and atraumatic.      Right Ear: External ear normal.      Left Ear: External ear normal.   Eyes:      Conjunctiva/sclera: Conjunctivae normal.      Pupils: Pupils are equal, round, and reactive to light.   Neck:      Musculoskeletal: Normal range of motion and neck supple.   Cardiovascular:      Rate and Rhythm: Normal rate and regular rhythm.      Heart sounds: Normal heart sounds.   Pulmonary:      Effort: Pulmonary effort is normal.      Breath sounds: Normal breath sounds.   Abdominal:      General: Bowel sounds are normal.      Palpations: Abdomen is soft.      Tenderness: There is no abdominal tenderness.   Musculoskeletal: Normal range of motion.   Skin:     General: Skin is warm and dry.      Capillary Refill: Capillary refill takes less than 2 seconds.   Neurological:      General: No focal deficit present.      Mental Status: He is alert and oriented to person, place, and time.   Psychiatric:         Mood and Affect: Mood normal.         Behavior: Behavior normal. Behavior is cooperative.         Thought Content: Thought content is not paranoid or delusional.  Thought content does not include homicidal or suicidal ideation.         Cognition and Memory: Cognition and memory normal.          MDM  Number of Diagnoses or Management Options  Alcohol dependence with unspecified alcohol-induced disorder (HCC): new and requires workup  Cocaine dependence without complication (HCC): new and requires workup     Amount and/or Complexity of Data Reviewed  Clinical lab tests: ordered and reviewed  Review and summarize past medical records: yes (Patient seen on 06/03/2019 at [facility]; seen by social worker as well.  Apparently the patient was supposed to call [facility] when he was discharged, and he was sponsored for his medications which were at the pharmacy.)    Risk of Complications, Morbidity, and/or Mortality  Presenting problems: moderate  Diagnostic procedures: minimal  Management options: moderate    Patient Progress  Patient progress: stable         Procedures    Patient was asked about his visit to [facility], and he stated that he made the phone call to [facility] yesterday at 12:30 PM, and they said they only take screening calls in the morning.  No one told him where the pharmacy was so he could not go pick up his medicines.  So apparently the only choice at that point was to go back out on the street and use more cocaine and drinks more alcohol... He states if I could help him find a pharmacy that he would be happy to pick up his medications there.  From a medical standpoint, the patient would be cleared to go to the [facility].    Results Reviewed:      Recent Results (from the past   24 hour(s))   DRUG SCREEN, URINE    Collection Time: 06/05/19  2:18 AM   Result Value Ref Range    PCP(PHENCYCLIDINE) Negative      BENZODIAZEPINES Negative      COCAINE Positive      AMPHETAMINES Negative      METHADONE Negative      THC (TH-CANNABINOL) Negative      OPIATES Negative      BARBITURATES Negative     CBC WITH AUTOMATED DIFF    Collection Time: 06/05/19   2:22 AM   Result Value Ref Range    WBC 10.6 4.3 - 11.1 K/uL    RBC 4.87 4.23 - 5.6 M/uL    HGB 14.9 13.6 - 17.2 g/dL    HCT 44.7 41.1 - 50.3 %    MCV 91.8 79.6 - 97.8 FL    MCH 30.6 26.1 - 32.9 PG    MCHC 33.3 31.4 - 35.0 g/dL    RDW 13.0 11.9 - 14.6 %    PLATELET 199 150 - 450 K/uL    MPV 10.9 9.4 - 12.3 FL    ABSOLUTE NRBC 0.00 0.0 - 0.2 K/uL    DF AUTOMATED      NEUTROPHILS 68 43 - 78 %    LYMPHOCYTES 23 13 - 44 %    MONOCYTES 7 4.0 - 12.0 %    EOSINOPHILS 1 0.5 - 7.8 %    BASOPHILS 0 0.0 - 2.0 %    IMMATURE GRANULOCYTES 0 0.0 - 5.0 %    ABS. NEUTROPHILS 7.2 1.7 - 8.2 K/UL    ABS. LYMPHOCYTES 2.5 0.5 - 4.6 K/UL    ABS. MONOCYTES 0.8 0.1 - 1.3 K/UL    ABS. EOSINOPHILS 0.1 0.0 - 0.8 K/UL    ABS. BASOPHILS 0.0 0.0 - 0.2 K/UL    ABS. IMM. GRANS. 0.0 0.0 - 0.5 K/UL   METABOLIC PANEL, COMPREHENSIVE    Collection Time: 06/05/19  2:22 AM   Result Value Ref Range    Sodium 144 136 - 145 mmol/L    Potassium 3.8 3.5 - 5.1 mmol/L    Chloride 108 (H) 98 - 107 mmol/L    CO2 29 21 - 32 mmol/L    Anion gap 7 7 - 16 mmol/L    Glucose 88 65 - 100 mg/dL    BUN 19 6 - 23 MG/DL    Creatinine 1.32 0.8 - 1.5 MG/DL    GFR est AA >60 >60 ml/min/1.73m2    GFR est non-AA >60 >60 ml/min/1.73m2    Calcium 9.0 8.3 - 10.4 MG/DL    Bilirubin, total 0.3 0.2 - 1.1 MG/DL    ALT (SGPT) 46 12 - 65 U/L    AST (SGOT) 32 15 - 37 U/L    Alk. phosphatase 111 50 - 136 U/L    Protein, total 7.0 6.3 - 8.2 g/dL    Albumin 3.4 (L) 3.5 - 5.0 g/dL    Globulin 3.6 (H) 2.3 - 3.5 g/dL    A-G Ratio 0.9 (L) 1.2 - 3.5     ETHYL ALCOHOL    Collection Time: 06/05/19  2:22 AM   Result Value Ref Range    ALCOHOL(ETHYL),SERUM 77 MG/DL       I discussed the results of all labs, procedures, radiographs, and treatments with the patient and available family.  Treatment plan is agreed upon and the patient is ready for discharge.  All voiced understanding of the discharge plan and medication instructions or   changes as appropriate.  Questions about treatment in the ED were  answered.  All were encouraged to return should symptoms worsen or new problems develop.

## 2019-06-05 NOTE — ED Notes (Signed)
I have reviewed discharge instructions with the patient.  The patient verbalized understanding.    Patient left ED via Discharge Method: ambulatory to Home with favor  Opportunity for questions and clarification provided.       Patient given 0 scripts.         To continue your aftercare when you leave the hospital, you may receive an automated call from our care team to check in on how you are doing.  This is a free service and part of our promise to provide the best care and service to meet your aftercare needs." If you have questions, or wish to unsubscribe from this service please call [phone].  Thank you for Choosing our [facility] Emergency Department.

## 2019-06-05 NOTE — ED Notes (Signed)
Report given to RN Holly.

## 2019-06-05 NOTE — ED Notes (Signed)
PT comes in with [facility] stating he needs to speak to a social worker to receive help for cocaine and alcohol addiction at the [facility] center. Denied any other complaints at this time.

## 2019-06-05 NOTE — ED Provider Notes (Signed)
49 year old black male with a history of bipolar disorder, schizophrenia, hypertension, hypercholesterolemia presents to the emergency department complaining of needing help with his cocaine and alcohol injections.  Patient states he has been here about a month, currently homeless and without funding.  Patient states he cannot recall what blood pressure medicine he was on, and states he was on trazodone and Risperdal.  Patient states he was treated a little over a year ago at the [facility] with good results for at least some time.  Patient does admit to using both cocaine and alcohol this evening.  Patient denies SI or HI    The history is provided by the patient.        No past medical history on file.    No past surgical history on file.      No family history on file.    Social History     Socioeconomic History   ??? Marital status: SINGLE     Spouse name: Not on file   ??? Number of children: Not on file   ??? Years of education: Not on file   ??? Highest education level: Not on file   Occupational History   ??? Not on file   Social Needs   ??? Financial resource strain: Not on file   ??? Food insecurity     Worry: Not on file     Inability: Not on file   ??? Transportation needs     Medical: Not on file     Non-medical: Not on file   Tobacco Use   ??? Smoking status: Current Every Day Smoker   ??? Smokeless tobacco: Never Used   Substance and Sexual Activity   ??? Alcohol use: Yes   ??? Drug use: Yes     Types: Cocaine   ??? Sexual activity: Not on file   Lifestyle   ??? Physical activity     Days per week: Not on file     Minutes per session: Not on file   ??? Stress: Not on file   Relationships   ??? Social Product manager on phone: Not on file     Gets together: Not on file     Attends religious service: Not on file     Active member of club or organization: Not on file     Attends meetings of clubs or organizations: Not on file     Relationship status: Not on file   ??? Intimate partner violence     Fear of current or ex partner:  Not on file     Emotionally abused: Not on file     Physically abused: Not on file     Forced sexual activity: Not on file   Other Topics Concern   ??? Not on file   Social History Narrative   ??? Not on file         ALLERGIES: Patient has no known allergies.    Review of Systems   Constitutional: Negative for chills and fever.   Psychiatric/Behavioral: Positive for dysphoric mood and sleep disturbance. Negative for hallucinations and suicidal ideas.   All other systems reviewed and are negative.      Vitals:    06/05/19 0205   BP: (!) 155/94   Pulse: 84   Resp: 18   Temp: 98.2 ??F (36.8 ??C)   SpO2: 98%   Weight: 113.4 kg (250 lb)   Height: 6' 5" (1.956 m)  Physical Exam  Vitals signs and nursing note reviewed.   Constitutional:       General: He is not in acute distress.     Appearance: He is well-developed.   HENT:      Head: Normocephalic and atraumatic.      Right Ear: External ear normal.      Left Ear: External ear normal.   Eyes:      Conjunctiva/sclera: Conjunctivae normal.      Pupils: Pupils are equal, round, and reactive to light.   Neck:      Musculoskeletal: Normal range of motion and neck supple.   Cardiovascular:      Rate and Rhythm: Normal rate and regular rhythm.      Heart sounds: Normal heart sounds.   Pulmonary:      Effort: Pulmonary effort is normal.      Breath sounds: Normal breath sounds.   Abdominal:      General: Bowel sounds are normal.      Palpations: Abdomen is soft.      Tenderness: There is no abdominal tenderness.   Musculoskeletal: Normal range of motion.   Skin:     General: Skin is warm and dry.      Capillary Refill: Capillary refill takes less than 2 seconds.   Neurological:      General: No focal deficit present.      Mental Status: He is alert and oriented to person, place, and time.   Psychiatric:         Mood and Affect: Mood normal.         Behavior: Behavior normal. Behavior is cooperative.         Thought Content: Thought content is not paranoid or delusional.  Thought content does not include homicidal or suicidal ideation.         Cognition and Memory: Cognition and memory normal.          MDM  Number of Diagnoses or Management Options  Alcohol dependence with unspecified alcohol-induced disorder [facility]): new and requires workup  Cocaine dependence without complication [facility]): new and requires workup     Amount and/or Complexity of Data Reviewed  Clinical lab tests: ordered and reviewed  Review and summarize past medical records: yes (Patient seen on 06/03/2019 at [facility]; seen by Education officer, museum as well.  Apparently the patient was supposed to call [facility] when he was discharged, and he was sponsored for his medications which were at the pharmacy.)    Risk of Complications, Morbidity, and/or Mortality  Presenting problems: moderate  Diagnostic procedures: minimal  Management options: moderate    Patient Progress  Patient progress: stable         Procedures    Patient was asked about his visit to [place], and he stated that he made the phone call to [facility] yesterday at 12:30 PM, and they said they only take screening calls in the morning.  No one told him where the pharmacy was so he could not go pick up his medicines.  So apparently the only choice at that point was to go back out on the street and use more cocaine and drinks more alcohol... He states if I could help him find a pharmacy that he would be happy to pick up his medications there.  From a medical standpoint, the patient would be cleared to go to the [facility].    Results Reviewed:      Recent Results (from the past  24 hour(s))   DRUG SCREEN, URINE    Collection Time: 06/05/19  2:18 AM   Result Value Ref Range    PCP(PHENCYCLIDINE) Negative      BENZODIAZEPINES Negative      COCAINE Positive      AMPHETAMINES Negative      METHADONE Negative      THC (TH-CANNABINOL) Negative      OPIATES Negative      BARBITURATES Negative     CBC WITH AUTOMATED DIFF    Collection Time: 06/05/19   2:22 AM   Result Value Ref Range    WBC 10.6 4.3 - 11.1 K/uL    RBC 4.87 4.23 - 5.6 M/uL    HGB 14.9 13.6 - 17.2 g/dL    HCT 44.7 41.1 - 50.3 %    MCV 91.8 79.6 - 97.8 FL    MCH 30.6 26.1 - 32.9 PG    MCHC 33.3 31.4 - 35.0 g/dL    RDW 13.0 11.9 - 14.6 %    PLATELET 199 150 - 450 K/uL    MPV 10.9 9.4 - 12.3 FL    ABSOLUTE NRBC 0.00 0.0 - 0.2 K/uL    DF AUTOMATED      NEUTROPHILS 68 43 - 78 %    LYMPHOCYTES 23 13 - 44 %    MONOCYTES 7 4.0 - 12.0 %    EOSINOPHILS 1 0.5 - 7.8 %    BASOPHILS 0 0.0 - 2.0 %    IMMATURE GRANULOCYTES 0 0.0 - 5.0 %    ABS. NEUTROPHILS 7.2 1.7 - 8.2 K/UL    ABS. LYMPHOCYTES 2.5 0.5 - 4.6 K/UL    ABS. MONOCYTES 0.8 0.1 - 1.3 K/UL    ABS. EOSINOPHILS 0.1 0.0 - 0.8 K/UL    ABS. BASOPHILS 0.0 0.0 - 0.2 K/UL    ABS. IMM. GRANS. 0.0 0.0 - 0.5 K/UL   METABOLIC PANEL, COMPREHENSIVE    Collection Time: 06/05/19  2:22 AM   Result Value Ref Range    Sodium 144 136 - 145 mmol/L    Potassium 3.8 3.5 - 5.1 mmol/L    Chloride 108 (H) 98 - 107 mmol/L    CO2 29 21 - 32 mmol/L    Anion gap 7 7 - 16 mmol/L    Glucose 88 65 - 100 mg/dL    BUN 19 6 - 23 MG/DL    Creatinine 1.32 0.8 - 1.5 MG/DL    GFR est AA >60 >60 ml/min/1.54m    GFR est non-AA >60 >60 ml/min/1.738m   Calcium 9.0 8.3 - 10.4 MG/DL    Bilirubin, total 0.3 0.2 - 1.1 MG/DL    ALT (SGPT) 46 12 - 65 U/L    AST (SGOT) 32 15 - 37 U/L    Alk. phosphatase 111 50 - 136 U/L    Protein, total 7.0 6.3 - 8.2 g/dL    Albumin 3.4 (L) 3.5 - 5.0 g/dL    Globulin 3.6 (H) 2.3 - 3.5 g/dL    A-G Ratio 0.9 (L) 1.2 - 3.5     ETHYL ALCOHOL    Collection Time: 06/05/19  2:22 AM   Result Value Ref Range    ALCOHOL(ETHYL),SERUM 77 MG/DL       I discussed the results of all labs, procedures, radiographs, and treatments with the patient and available family.  Treatment plan is agreed upon and the patient is ready for discharge.  All voiced understanding of the discharge plan and medication instructions or  changes as appropriate.  Questions about treatment in the ED were  answered.  All were encouraged to return should symptoms worsen or new problems develop.

## 2019-06-05 NOTE — ED Notes (Signed)
I have reviewed discharge instructions with the patient.  The patient verbalized understanding.    Patient left ED via Discharge Method: ambulatory to Home with favor  Opportunity for questions and clarification provided.       Patient given 0 scripts.         To continue your aftercare when you leave the hospital, you may receive an automated call from our care team to check in on how you are doing.  This is a free service and part of our promise to provide the best care and service to meet your aftercare needs.??? If you have questions, or wish to unsubscribe from this service please call [phone].  Thank you for Choosing our [place] [facility].

## 2019-06-05 NOTE — ED Triage Notes (Signed)
PT comes in with [facility] stating he needs to speak to a social worker to receive help for cocaine and alcohol addiction at the [place] [facility]. Denied any other complaints at this time.

## 2019-10-30 IMAGING — DX PORTABLE CHEST - 1 VIEW
1 series · 1 of 1 positions shown · non-contrast
Comparison: None.

CLINICAL DATA: Chest pain x2 hours.

EXAM:
PORTABLE CHEST 1 VIEW

[chest]
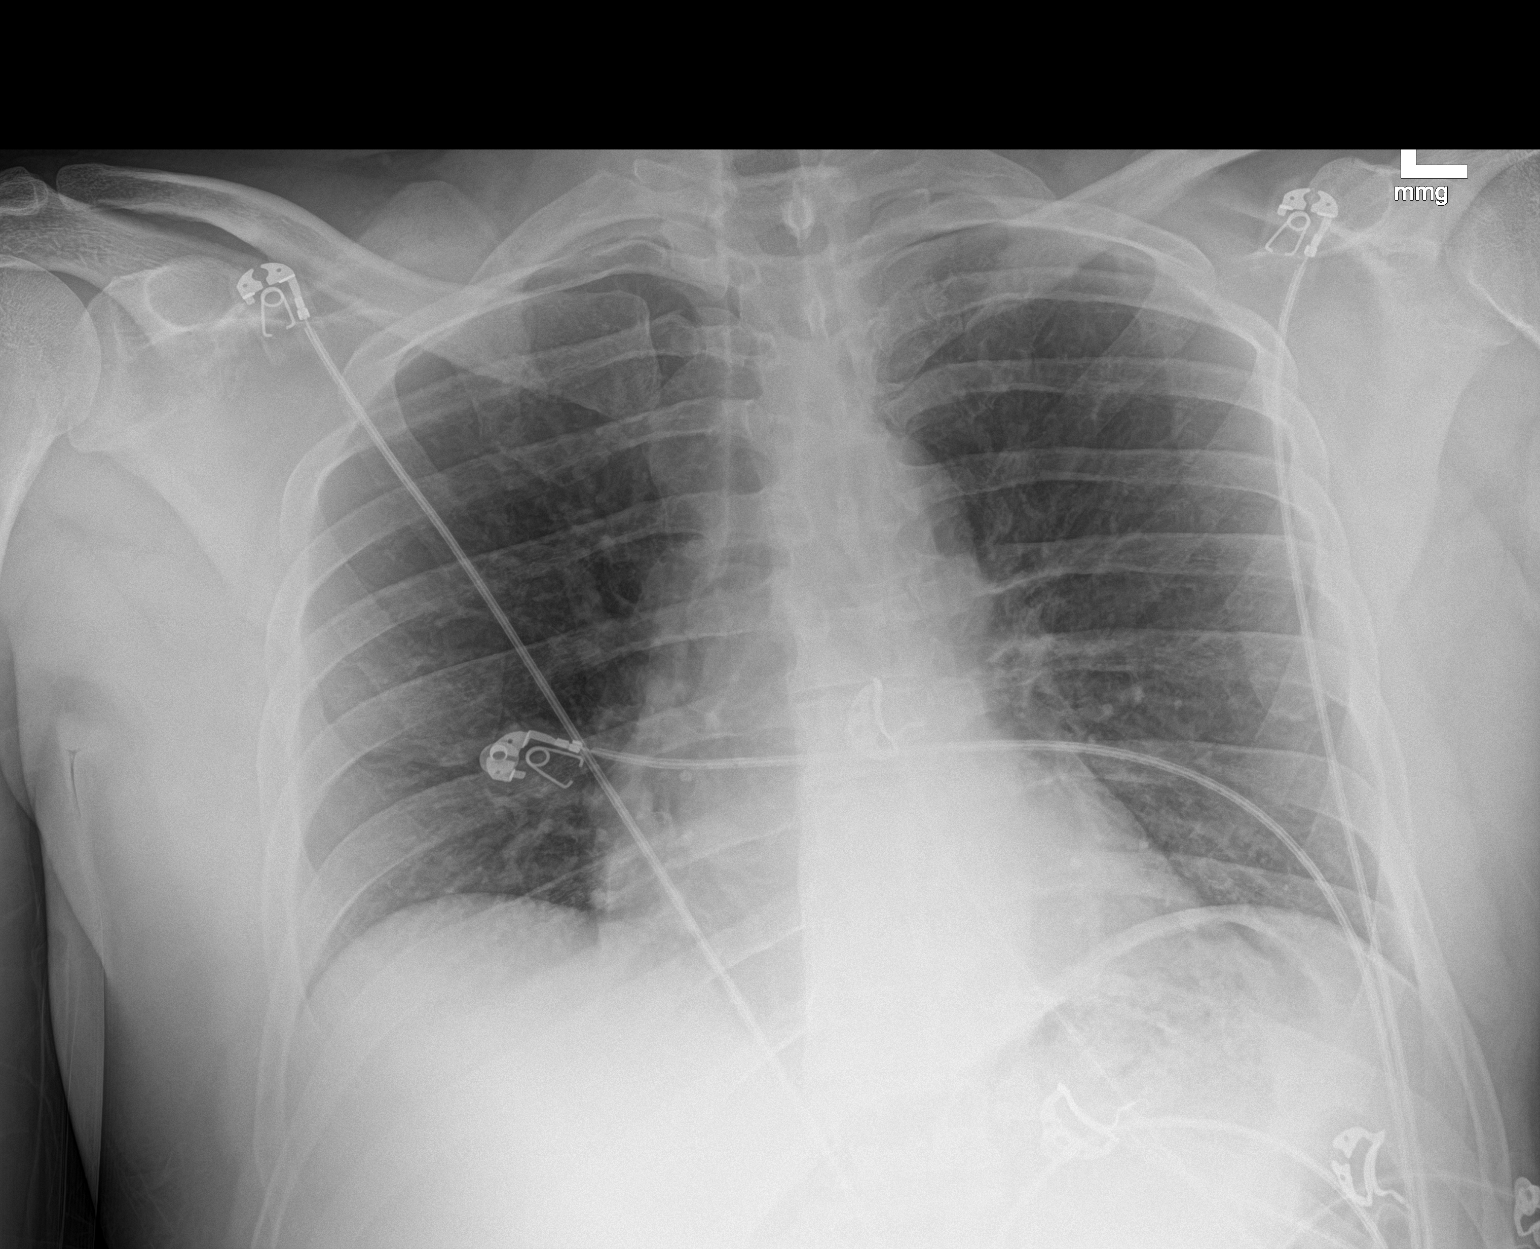

[1 of 1 positions shown; findings below may reference images not displayed]

FINDINGS: The heart size and mediastinal contours are within normal limits.
Both lungs are clear. The visualized skeletal structures are
unremarkable.
IMPRESSION: No active disease.

## 2019-10-31 IMAGING — CR CHEST - 2 VIEW
2 series · 2 of 2 positions shown · non-contrast
Comparison: 05/12/2018

CLINICAL DATA: Chest pain.  Calcaneus.

EXAM:
CHEST - 2 VIEW

[w chest pa]
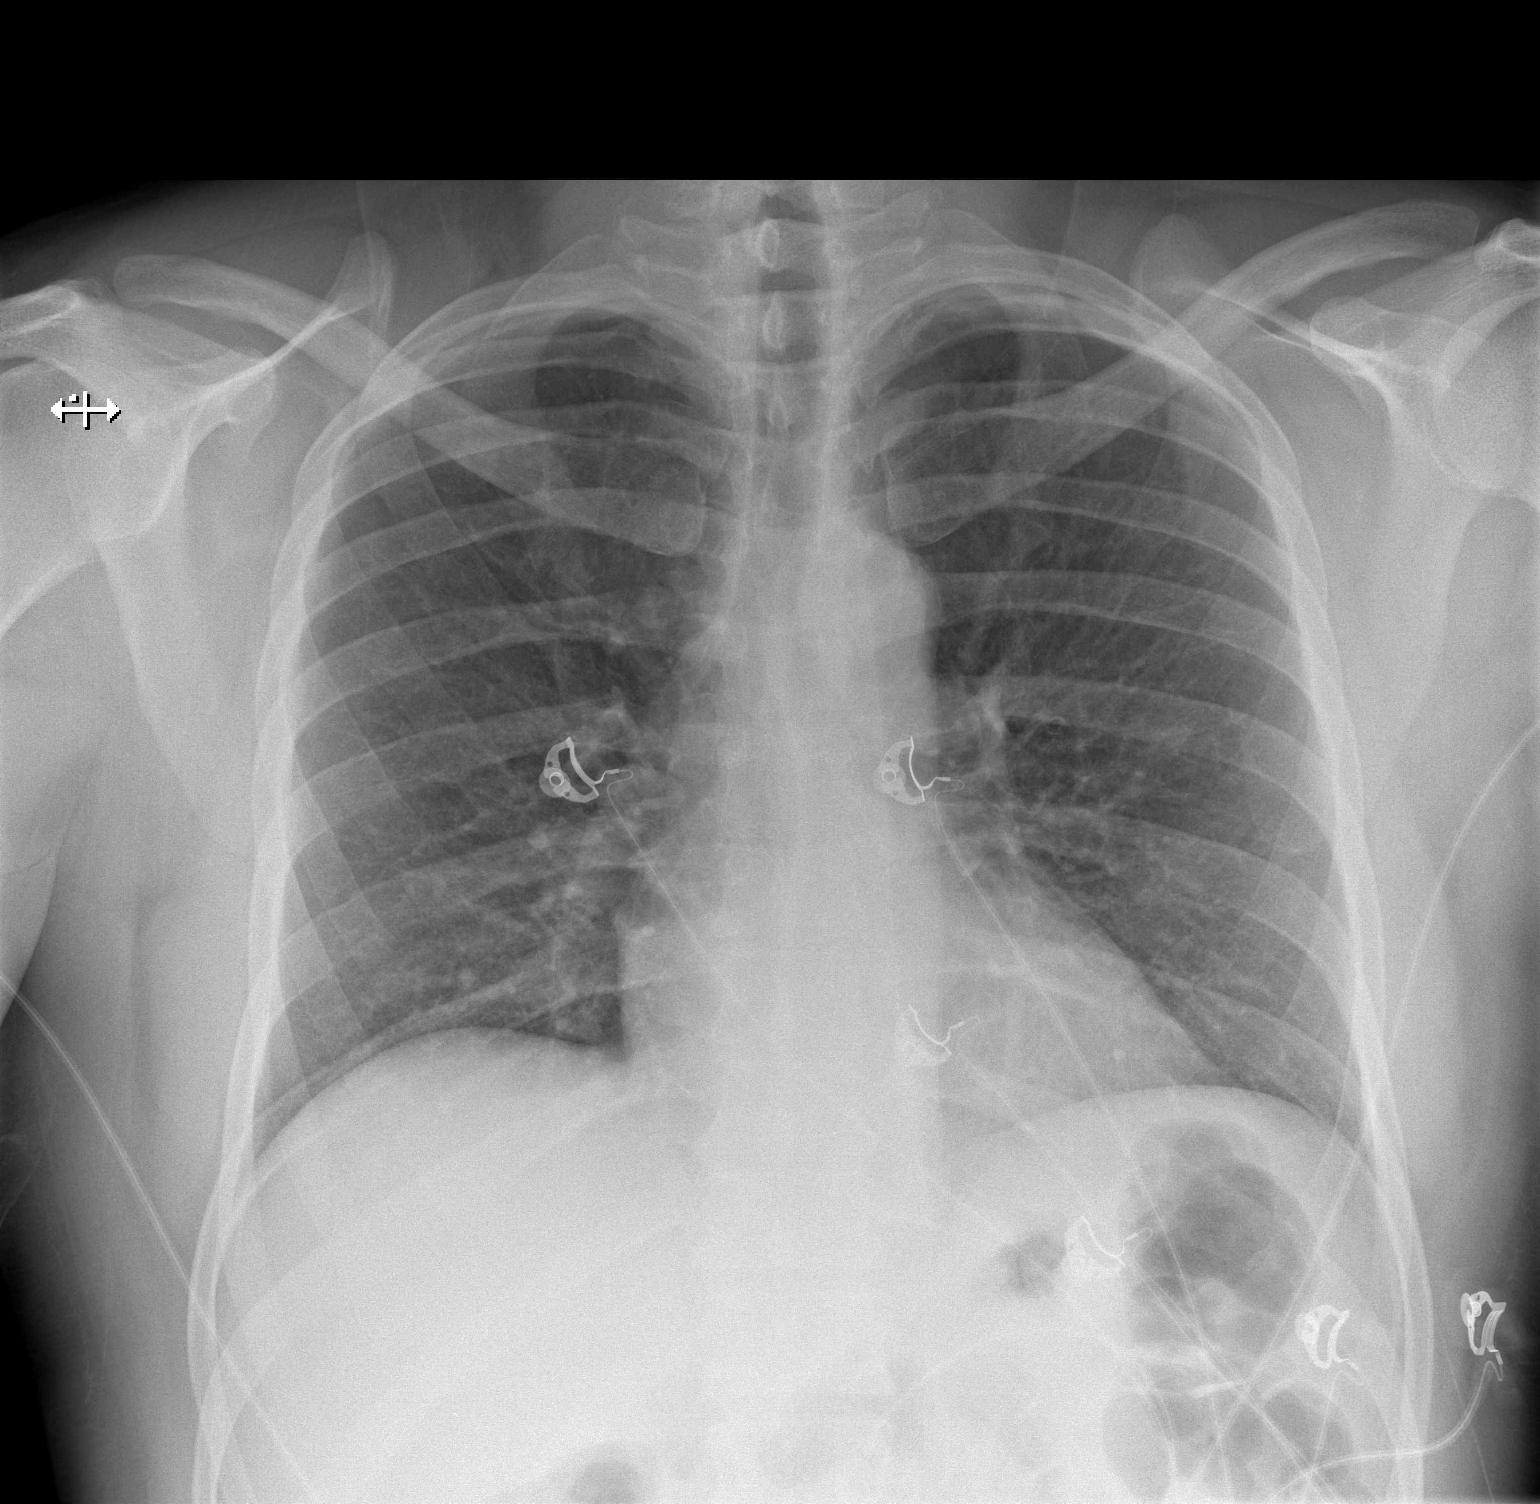

[w chest lat]
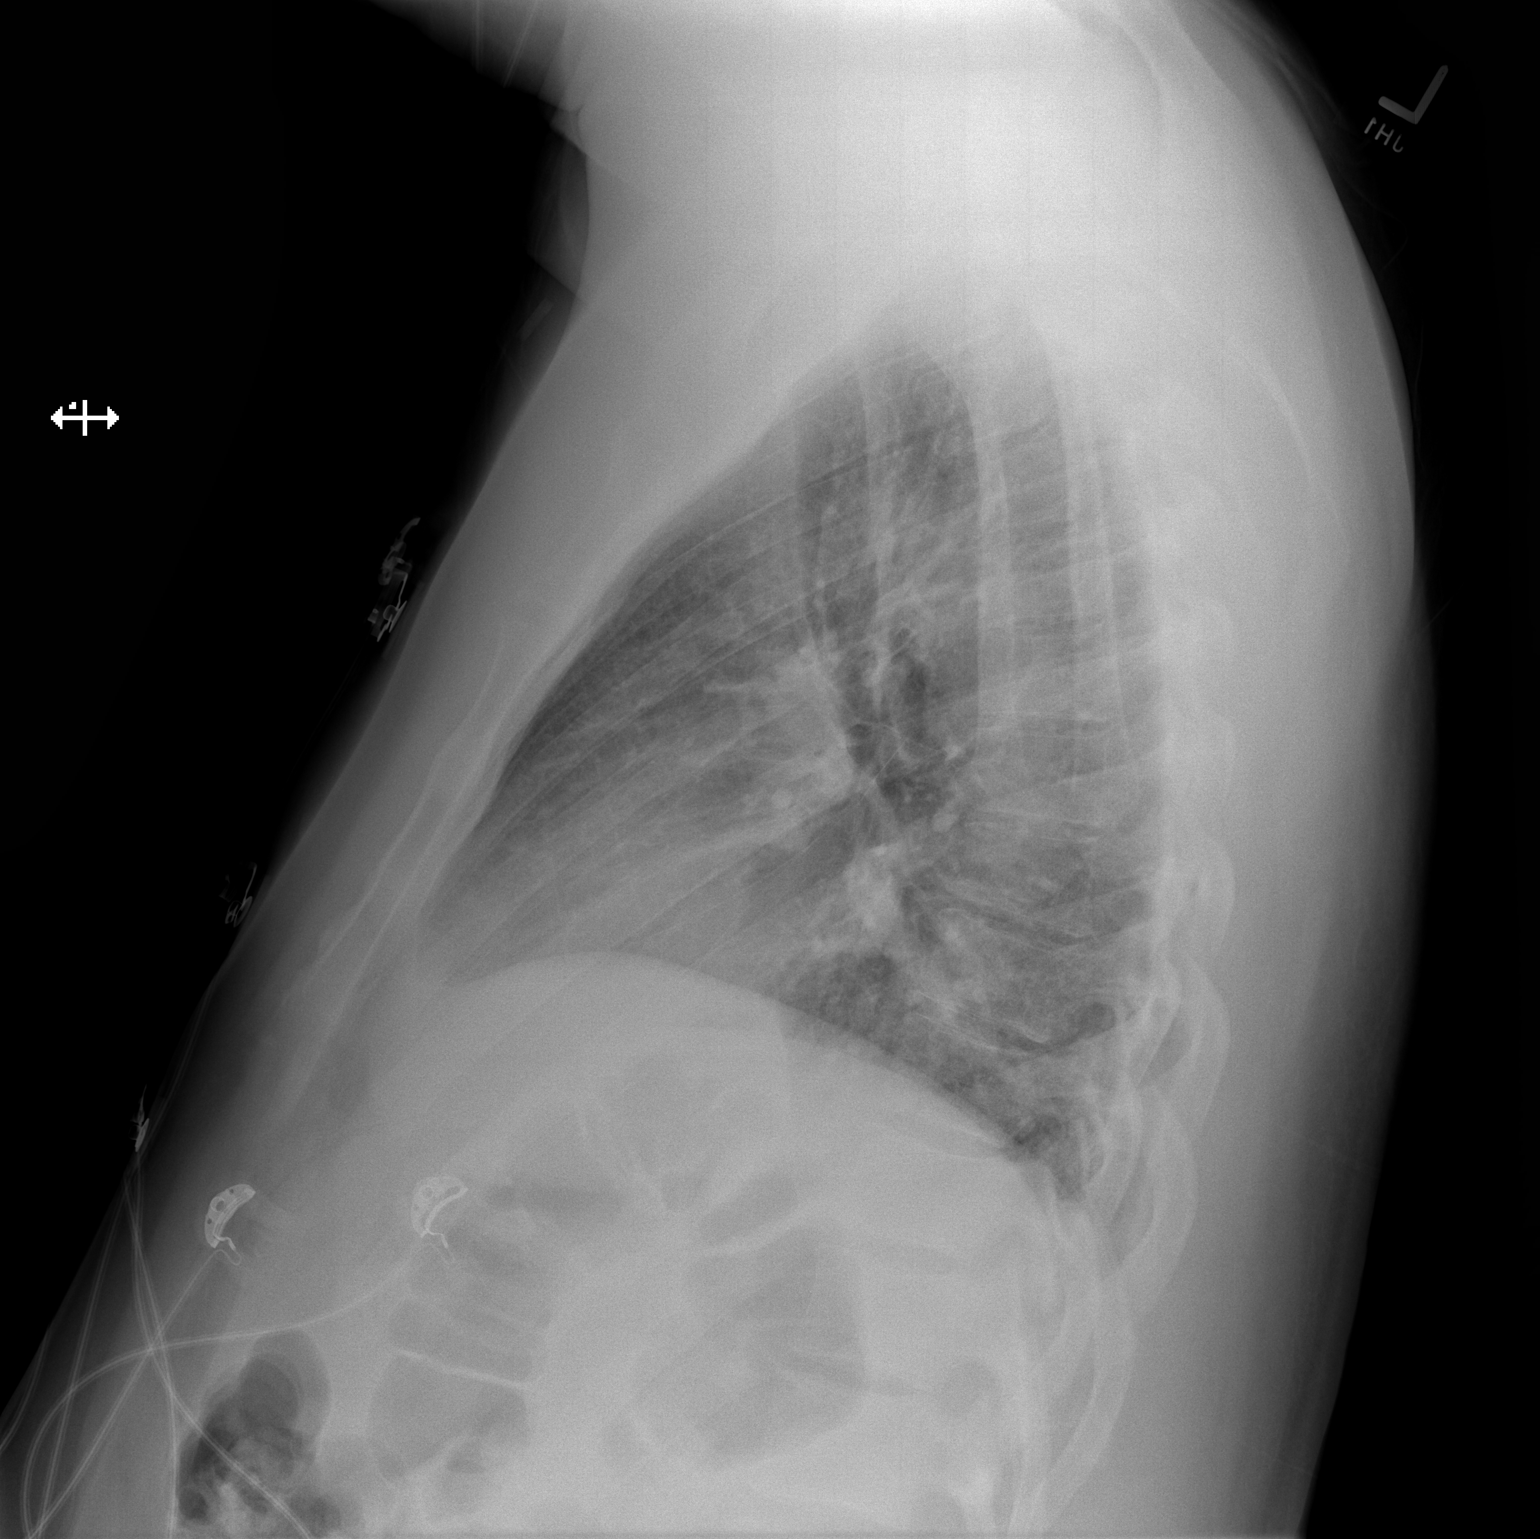

[2 of 2 positions shown; findings below may reference images not displayed]

FINDINGS: The heart size and mediastinal contours are within normal limits.
Both lungs are clear. The visualized skeletal structures are
unremarkable.
IMPRESSION: No active cardiopulmonary disease.

## 2021-03-17 ENCOUNTER — Inpatient Hospital Stay
Admit: 2021-03-17 | Discharge: 2021-03-17 | Disposition: A | Discharge status: Home / Self Care | Payer: Medicaid (Managed Care) | Attending: Emergency Medicine

## 2021-03-17 DIAGNOSIS — Z5900 Homelessness unspecified: Secondary | ICD-10-CM

## 2021-03-17 LAB — CBC WITH AUTO DIFFERENTIAL
Absolute Eos #: 0.1 10*3/uL (ref 0.0–0.8)
Absolute Immature Granulocyte: 0 10*3/uL (ref 0.0–0.5)
Absolute Lymph #: 2.3 10*3/uL (ref 0.5–4.6)
Absolute Mono #: 0.9 10*3/uL (ref 0.1–1.3)
Basophils Absolute: 0.1 10*3/uL (ref 0.0–0.2)
Basophils: 1 % (ref 0.0–2.0)
Eosinophils %: 1 % (ref 0.5–7.8)
Hematocrit: 44.4 % (ref 41.1–50.3)
Hemoglobin: 14.7 g/dL (ref 13.6–17.2)
Immature Granulocytes: 0 % (ref 0.0–5.0)
Lymphocytes: 24 % (ref 13–44)
MCH: 28.9 PG (ref 26.1–32.9)
MCHC: 33.1 g/dL (ref 31.4–35.0)
MCV: 87.4 FL (ref 82–102)
MPV: 11.2 FL (ref 9.4–12.3)
Monocytes: 10 % (ref 4.0–12.0)
Platelets: 255 10*3/uL (ref 150–450)
RBC: 5.08 M/uL (ref 4.23–5.6)
RDW: 13.5 % (ref 11.9–14.6)
Seg Neutrophils: 64 % (ref 43–78)
Segs Absolute: 6.3 10*3/uL (ref 1.7–8.2)
WBC: 9.7 10*3/uL (ref 4.3–11.1)
nRBC: 0 10*3/uL (ref 0.0–0.2)

## 2021-03-17 LAB — COMPREHENSIVE METABOLIC PANEL
ALT: 52 U/L (ref 12–65)
AST: 56 U/L — ABNORMAL HIGH (ref 15–37)
Albumin/Globulin Ratio: 1 (ref 0.4–1.6)
Albumin: 2.8 g/dL — ABNORMAL LOW (ref 3.5–5.0)
Alk Phosphatase: 78 U/L (ref 50–136)
Anion Gap: 10 mmol/L (ref 2–11)
BUN: 9 MG/DL (ref 6–23)
CO2: 27 mmol/L (ref 21–32)
Calcium: 8.4 MG/DL (ref 8.3–10.4)
Chloride: 101 mmol/L (ref 101–110)
Creatinine: 1.1 MG/DL (ref 0.8–1.5)
Est, Glom Filt Rate: >60 mL/min/{1.73_m2} (ref >60)
Globulin: 2.7 g/dL — ABNORMAL LOW (ref 2.8–4.5)
Glucose: 89 mg/dL (ref 65–100)
Potassium: 3.8 mmol/L (ref 3.5–5.1)
Sodium: 138 mmol/L (ref 133–143)
Total Bilirubin: 0.8 MG/DL (ref 0.2–1.1)
Total Protein: 5.5 g/dL — ABNORMAL LOW (ref 6.3–8.2)

## 2021-03-17 LAB — EKG 12-LEAD
Atrial Rate: 85 {beats}/min
Diagnosis: NORMAL
P Axis: 81 degrees
P-R Interval: 146 ms
Q-T Interval: 372 ms
QRS Duration: 78 ms
QTc Calculation (Bazett): 442 ms
R Axis: 69 degrees
T Axis: 25 degrees
Ventricular Rate: 85 {beats}/min

## 2021-03-17 LAB — MAGNESIUM: Magnesium: 2.2 mg/dL (ref 1.8–2.4)

## 2021-03-17 LAB — ETHANOL: Ethanol Lvl: <3 MG/DL

## 2021-03-17 LAB — ACETAMINOPHEN LEVEL: Acetaminophen Level: 4 ug/mL — ABNORMAL LOW (ref 10.0–30.0)

## 2021-03-17 LAB — SALICYLATE LEVEL: Salicylate, Serum: <1.7 MG/DL — ABNORMAL LOW (ref 2.8–20.0)

## 2021-03-17 NOTE — ED Notes (Signed)
Constant Observer Yes - Name: joyce   Constant Observer Oriented yes   High risk patients are in line of sight at all times Yes   Excess equipment/medical supplies not necessary for the care of the patient removed Yes   All sharp or dangerous objects are removed from room: including but not limited to belts, pens & pencils, needles, medications, cosmetics, lighters, matches, nail files, watches, necklaces, glass objects, razors, razor blades, knives, aerosol sprays, drawstring pants, shoes, cords (telephone, call bells, etc.) cleaning wipes or other cleaning items, aluminum cans, not permanently attached wall d??cor Yes   Telephone/cell phone removed as well as TV remote (batteries can be swallowed) Yes   Patient belongings removed and labeled at nurses station Yes   Excess linen is removed from room Yes   All plastic bags are removed from the room and replaced with paper trash bags Yes   Patient is in paper scrubs or appropriate gown and using hospital socks with rubber soles Yes   No metal, hard eating utensils or hard plates are on meal tray Yes   Remove all cleaning agents used by [employer] Yes   If Crucifix is hanging on a nail, remove Crucifix as well as the nail Yes         *If any question above is answered "No," documentation is required.         Ross Marcus, RN  03/17/21 [phone]

## 2021-03-17 NOTE — ED Triage Notes (Signed)
Patient arrives via ems from home. EMS states the patient has suicidal ideations. Patient states he attempted to overdose. EMS states that patient became aggravated during assessment and while the police was searching his backpack.

## 2021-03-17 NOTE — ED Notes (Signed)
I have reviewed discharge instructions with the patient.  The patient verbalized understanding.    Patient left ED via Discharge Method: ambulatory to Home with self  Opportunity for questions and clarification provided.       Patient given 0 scripts.   No esign      To continue your aftercare when you leave the hospital, you may receive an automated call from our care team to check in on how you are doing.  This is a free service and part of our promise to provide the best care and service to meet your aftercare needs.??? If you have questions, or wish to unsubscribe from this service please call [phone].  Thank you for Choosing our [facility] Emergency Department.      Ross Marcus, RN  03/17/21 1252

## 2021-03-17 NOTE — Care Coordination-Inpatient (Addendum)
1230 pm-- pt was provided [facility] center contact information and instructed to call them tomorrow by 0830 am and they would have a bed available for him per Crystal with [facility], pt to be dc and instructed the need to call for placement.    1210 pm Crystal with [facility] in department to see pt.      CM was asked to have [facility] come speak with pt about possible resources and assistance with treatment center. CM contacted Crystal with [facility] and she is coming to see pt. To assist with possible treatment assistance.

## 2021-03-17 NOTE — ED Notes (Signed)
Report given to Christina, RN. Transferring patient care at this time.      Maliki Gignac, RN  03/17/21 0711

## 2021-03-17 NOTE — ED Provider Notes (Signed)
Emergency Department Provider Note                   PCP:                None None               Age: 51 y.o.      Sex: male       ICD-10-CM    1. Homelessness  Z59.00           DISPOSITION Decision To Discharge 03/17/2021 11:36:00 AM       Medical Decision Making  Suicidal ideations, homicidal ideations, self-inflicted injury,    Schizophrenia, schizoaffective disorder, personality disorder, major depression,   depression with anxiety, depression reactive to situation, depression, anxiety, panic  attack, hyperventilation syndrome, bipolar disorder,    Substance abuse, medication noncompliance, drug abuse, alcohol abuse, alcohol  intoxication,           Complexity of Problem: 2 or more stable chronic illnesses. (4)    I have conducted an independent ordering and review of Labs.  I have conducted an independent ordering and review of EKG.  I have reviewed records from an external source: ED records from outside this hospital.  I have reviewed records from an external source: provider visit notes from PCP.  I have reviewed records from an external source: provider visit notes from outside specialist.  I have reviewed records from an external source: previous old EKG's reviewed.  I have reviewed records from an external source: previous lab results from outside ED.  I have reviewed records from an external source: previous imaging studies from outside ED.    Social determinant affecting care: food insecurity  Social determinant affecting care: housing insecurity         ED Course as of 03/17/21 1138   Mon Mar 17, 2021   1135 I talked to Ricardo Jericho the nurse practitioner with psychiatry and she found the patient under another MRN number with extensive ER visits for homelessness and similar nonspecific thoughts of self-harm.  At this time she does not feel as though the patient poses a threat to himself and this is more related to the patient's living situation. [KH]      ED Course User Index  [KH] Keene Breath, DO        Orders Placed This Encounter   Procedures    CBC with Auto Differential    CMP    Salicylate    Acetaminophen Level    ETOH    Magnesium    Urine Drug Screen    ADULT DIET; Regular    Complete CSSRS Screen    Complete SBIRT Screen    Inpatient consult to Psychiatry    EKG 12 Lead        Medications - No data to display    New Prescriptions    No medications on file        Joshua Solomon is a 51 y.o. male who presents to the Emergency Department with chief complaint of    Chief Complaint   Patient presents with    Suicidal      51 year old male presenting to the emergency department today complaining of suicidal ideations.  Patient does not have any plan.  Patient says that he has been to [facility] within the last month but since being discharged has not gotten any of his medicine.  He says he supposed to be on trazodone Zoloft and  Risperdal but does not know the dosing.  The patient denied trying to harm himself today.  When asking what he would do if he wanted to hurt himself he said "I would not and I would just come here."         Review of Systems   All other systems reviewed and are negative.    Past Medical History:   Diagnosis Date    Anxiety     Depression     Hyperlipidemia     Hypertension         History reviewed. No pertinent surgical history.     History reviewed. No pertinent family history.     Social History     Socioeconomic History    Marital status: Single     Spouse name: None    Number of children: None    Years of education: None    Highest education level: None   Tobacco Use    Smoking status: Every Day   Substance and Sexual Activity    Alcohol use: Yes    Drug use: Yes     Types: Methamphetamines (Crystal Meth), Cocaine        Allergies: Patient has no known allergies.    Previous Medications    CHLORDIAZEPOXIDE (LIBRIUM) 5 MG CAPSULE    Take 5 mg by mouth 3 times daily as needed for Anxiety.    RISPERIDONE PO    Take by mouth    SERTRALINE HCL (ZOLOFT PO)    Take by  mouth    TRAZODONE HCL PO    Take by mouth        Vitals signs and nursing note reviewed.   Patient Vitals for the past 4 hrs:   Temp Pulse Resp BP SpO2   03/17/21 0838 98.6 ??F (37 ??C) 84 17 (!) 155/98 99 %          Physical Exam     GENERAL:The patient has Body mass index is 28 kg/m??. Well-hydrated.    VITAL SIGNS: Heart rate, blood pressure, respiratory rate reviewed as recorded in  nurse's notes  EYES: Pupils reactive. Extraocular motion intact. No conjunctival redness or drainage.  NECK: Supple, no meningeal signs. Trachea midline. No masses or thyromegaly.  LUNGS: Breath sounds clear and equal bilaterally no accessory muscle use.  CHEST: No deformity  CARDIOVASCULAR: Regular rate and rhythm  ABDOMEN: Soft without tenderness. No palpable masses or organomegaly. No  peritoneal signs. No rigidity.  EXTREMITIES: No clubbing or cyanosis. No joint swelling. Normal muscle tone. No  restricted range of motion appreciated.  NEUROLOGIC: Sensation is grossly intact. Cranial nerve exam reveals face is  symmetrical, tongue is midline speech is clear.  SKIN: No rash or petechiae. Good skin turgor palpated.  PSYCHIATRIC: Alert and oriented. Appropriate behavior and judgment.      Procedures    ED EKG Interpretation  EKG was interpreted in the absence of a cardiologist.    Rate: 85  EKG Interpretation: EKG Interpretation: sinus rhythm  ST Segments: Normal ST segments - NO STEMI      Results for orders placed or performed during the hospital encounter of 03/17/21   CBC with Auto Differential   Result Value Ref Range    WBC 9.7 4.3 - 11.1 K/uL    RBC 5.08 4.23 - 5.6 M/uL    Hemoglobin 14.7 13.6 - 17.2 g/dL    Hematocrit 44.4 41.1 - 50.3 %    MCV 87.4 82 - 102 FL  MCH 28.9 26.1 - 32.9 PG    MCHC 33.1 31.4 - 35.0 g/dL    RDW 13.5 11.9 - 14.6 %    Platelets 255 150 - 450 K/uL    MPV 11.2 9.4 - 12.3 FL    nRBC 0.00 0.0 - 0.2 K/uL    Differential Type AUTOMATED      Seg Neutrophils 64 43 - 78 %    Lymphocytes 24 13 - 44 %     Monocytes 10 4.0 - 12.0 %    Eosinophils % 1 0.5 - 7.8 %    Basophils 1 0.0 - 2.0 %    Immature Granulocytes 0 0.0 - 5.0 %    Segs Absolute 6.3 1.7 - 8.2 K/UL    Absolute Lymph # 2.3 0.5 - 4.6 K/UL    Absolute Mono # 0.9 0.1 - 1.3 K/UL    Absolute Eos # 0.1 0.0 - 0.8 K/UL    Basophils Absolute 0.1 0.0 - 0.2 K/UL    Absolute Immature Granulocyte 0.0 0.0 - 0.5 K/UL   CMP   Result Value Ref Range    Sodium 138 133 - 143 mmol/L    Potassium 3.8 3.5 - 5.1 mmol/L    Chloride 101 101 - 110 mmol/L    CO2 27 21 - 32 mmol/L    Anion Gap 10 2 - 11 mmol/L    Glucose 89 65 - 100 mg/dL    BUN 9 6 - 23 MG/DL    Creatinine 1.10 0.8 - 1.5 MG/DL    Est, Glom Filt Rate >60 >60 ml/min/1.28m    Calcium 8.4 8.3 - 10.4 MG/DL    Total Bilirubin 0.8 0.2 - 1.1 MG/DL    ALT 52 12 - 65 U/L    AST 56 (H) 15 - 37 U/L    Alk Phosphatase 78 50 - 136 U/L    Total Protein 5.5 (L) 6.3 - 8.2 g/dL    Albumin 2.8 (L) 3.5 - 5.0 g/dL    Globulin 2.7 (L) 2.8 - 4.5 g/dL    Albumin/Globulin Ratio 1.0 0.4 - 1.6     Salicylate   Result Value Ref Range    Salicylate, Serum <<6.4(L) 2.8 - 20.0 MG/DL   Acetaminophen Level   Result Value Ref Range    Acetaminophen Level 4 (L) 10.0 - 30.0 ug/mL   ETOH   Result Value Ref Range    Ethanol Lvl <3 MG/DL   Magnesium   Result Value Ref Range    Magnesium 2.2 1.8 - 2.4 mg/dL   EKG 12 Lead   Result Value Ref Range    Ventricular Rate 85 BPM    Atrial Rate 85 BPM    P-R Interval 146 ms    QRS Duration 78 ms    Q-T Interval 372 ms    QTc Calculation (Bazett) 442 ms    P Axis 81 degrees    R Axis 69 degrees    T Axis 25 degrees    Diagnosis Normal sinus rhythm         No orders to display                         Voice dictation software was used during the making of this note.  This software is not perfect and grammatical and other typographical errors may be present.  This note has not been completely proofread for errors.       KCrissie Sickles  [place], [place]  03/17/21 1138

## 2021-03-17 NOTE — ED Notes (Signed)
Constant Observer Yes - Name: Thayer Ohm, Medic    Constant Observer Oriented yes   High risk patients are in line of sight at all times Yes   Excess equipment/medical supplies not necessary for the care of the patient removed Yes   All sharp or dangerous objects are removed from room: including but not limited to belts, pens & pencils, needles, medications, cosmetics, lighters, matches, nail files, watches, necklaces, glass objects, razors, razor blades, knives, aerosol sprays, drawstring pants, shoes, cords (telephone, call bells, etc.) cleaning wipes or other cleaning items, aluminum cans, not permanently attached wall d??cor Yes   Telephone/cell phone removed as well as TV remote (batteries can be swallowed) Yes   Patient belongings removed and labeled at nurses station Yes   Excess linen is removed from room Yes   All plastic bags are removed from the room and replaced with paper trash bags Yes   Patient is in paper scrubs or appropriate gown and using hospital socks with rubber soles Yes   No metal, hard eating utensils or hard plates are on meal tray Yes   Remove all cleaning agents used by [employer] Yes   If Crucifix is hanging on a nail, remove Crucifix as well as the nail Yes       *If any question above is answered "No," documentation is required.      Kirby Crigler, RN  03/17/21 [phone]

## 2021-03-17 NOTE — Care Coordination-Inpatient (Signed)
Chart review complete, CM met with pt at bedside, pt found laying on stretcher refused to sit up in bed to speak with CM states "its cold in here",  pt states he is hearing voices that are telling him th hurt himself. Pt states he is new to the area and arrived here by train from [place], pt states he is homeless when asked about staying at the shelter he states "I am not staying at no shelter", pt endorses he is unemployed at this time and does not have any other income. Pt is noted to have Medicaid of [place] listed in his chart. Pt denies family in the area or available to help him. Pt does states he wants assistance with substance abuse but does not specify which substance.    Pt is homeless, pending psych evaluation at this time.    CM staff will remain available to assist with dc needs as needed.       03/17/21 0916   Service Assessment   Patient Orientation Alert and Oriented   Cognition Alert   History Provided By Patient   Primary Caregiver Self   Accompanied By/Relationship none   Support Systems None   Prior Functional Level Independent in ADLs/IADLs   Current Functional Level Independent in ADLs/IADLs   Can patient return to prior living arrangement Yes   Ability to make needs known: Good   Family able to assist with home care needs: No   Would you like for me to discuss the discharge plan with any other family members/significant others, and if so, who? No   CM/SW Referral Psychiatry   Social/Functional History   Lives With Alone   Type of Home Homeless   ADL Assistance Independent   Ambulation Assistance Independent   Transfer Assistance Independent   Occupation Unemployed   Discharge Planning   Type of Residence Other (Comment)  (unknown)   Current Services Prior To Admission None   Potential Assistance Needed N/A  (unknown)   DME Ordered? No   Potential Assistance Purchasing Medications Yes   Type of Home Care Services None   Patient expects to be discharged to: Unknown

## 2021-03-17 NOTE — Discharge Instructions (Addendum)
Bed should be available with the [facility] and he is planning on calling the [facility] in the morning to see about getting placement there.    Joshua Solomon   [phone]   They have multiple resources to help you.  Please call.  [email or web address]     Other local resources that are available are:       [facility]    [phone]  [email or web address] for inpatient and outpatient substance abuse issues.    [facility] [phone]  Medication assisted treatment    [facility]  [phone]     Suicide Hotline   1-800-SUICIDE     Narcotics Anonymous   www.na.org  Alcoholics Anonymous  [email or web address]

## 2021-03-17 NOTE — Behavioral Health Treatment Team (Signed)
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    Acetaminophen Level 4 (L) 10.0 - 30.0 ug/mL   ETOH    Collection Time: 03/17/21   6:20 AM   Result Value Ref Range    Ethanol Lvl <3 MG/DL   Magnesium    Collection Time: 03/17/21  6:20 AM   Result Value Ref Range    Magnesium 2.2 1.8 - 2.4 mg/dL       Medical Review Of Systems:  Psychological ROS: positive for - depression and sleep disturbances   Psychological ROS:  negative for - disorientation, hallucinations, hostility, irritability, memory difficulties, current or active suicidal or homicidal ideation       Mental Status Evaluation:  General Appearance normal body habitus, appears stated age  General Behavior Calm, cooperative with good eye contact  Psychomotor Activity Normoactive - no restlessness or tremors noted at this time  Gait and Station Did not observe, pt in bed  Speech   fluent, normal volume, normal tone, normal rate, normal prosody, and normal amount  Mood                          "I need to go to detox"  Affect    appropriate  Thought Process        coherent, logical  Thought Content/Perceptual Disturbances denies suicidal/homicidal ideation, auditory/visual hallucinations, paranoia, delusions, grandiosity, increased goal directed activity, preoccupation, obsessions/compulsions and phobias  Cognition/Sensorium Alert and oriented to person, place, time and situation   Insight  fair  Judgment Poor r/t continued drug use / non-compliance    PHQ Scores 03/17/2021   PHQ2 Score 2   PHQ9 Score 6     Interpretation of Total Score Depression Severity: 1-4 = Minimal depression, 5-9 = Mild depression, 10-14 = Moderate depression, 15-19 = Moderately severe depression, 20-27 = Severe depression     GAD 7 SCORE 03/17/2021   GAD-7 Total Score 4     Interpretation of GAD-7 score: 5-9 = mild anxiety, 10-14 = moderate anxiety, 15+ = severe anxiety. Recommend referral to behavioral health for scores 10 or greater.       ASSESSMENT  Psychiatric Diagnoses:  Polysubstance abuse ([place]) [F19.10 (ICD-10-CM)], Psychosocial stressors [Z65.8 (ICD-10-CM)], Housing instability [Z59.819 (ICD-10-CM)], History  of medication noncompliance [Z91.14 (ICD-10-CM)], Anxiety and depression [F41.9, F32.A (ICD-10-CM)], History of schizoaffective disorder [Z86.59 (ICD-10-CM)], Passive suicidal ideations [R45.851 (ICD-10-CM)]        Plan:  - 51 yo M with hx of polysubstance abuse, severe alcohol use disorder, substance induced mood disorder, MDD with psychotic features, cocaine abuse, schizoaffective, schizophrenia, homelessness / Note - another chart found on pt with MRN [id number] - some hx obtained from this chart as well) - NOTE:  in reviewing hx - from 2019 to present - pt has had approximately 121 ED / hospital visits for similar complaints (behavioral health / drug abuse) in different areas of the [place] - including:  [place], [place], [place], [place], [place], [place], [place], [place] - March 2020 / [place], [place] - April 2020 / [place], [place] - May 2020    - Pt with noted long hx of ED visits - similar behavior and complaint  - Pt does not endorse current or active SI/HI - no real intent or plan - instead focused on homelessness and inpt placement - complaint with probable secondary gain of housing/shelter (noting hx of similar)  - Pt negative for AVH - does not appear to be responding to any internal stimuli at this time / no evidence of disorganized thought, speech or behavior  - Pt has a low frustration tolerance that can be mitigated with abstinence of drug use and compliance with outpt treatment and follow up / and working with community resources to gain improvement in psychosocial circumstances  - Pt does NOT meet criteria for IVC at this time and from a psychiatric standpoint can discharge with outpt follow up when medically cleared  - The patient's character structure makes him at risk to engage in activities designed to gain hospitalization  or be admitted for nonclinical reasons. He is more interested in hospitalization than treatment and he remains forward-thinking and future-oriented. There is no observable evidence of any  impairment in reality testing and he is aware of the potential ramifications of his actions. There is no acute evidence of a risk of violence emanating from mental illness  - Inpatient hospitalization at this time would not help to solve patients chronic social and environmental needs / pt made aware that his emotional and behavioral patterns (noted repeatedly in pts hx) would be further complicated by his continued non-compliance with medications/outpt follow up and substance abuse    - Pt requests to speak with [facility] rep for possible placement in a substance abuse program - CM aware and has contacted Crystal with [facility] - she will come see pt in the ER  - No prescriptions or medication changes at this time - pt already has prescriptions- defer any changes to an outpt provider  - Referral to [facility] if pt not already established  - Pt verbally contracts for safety and will return with any worsening of symptoms or any active SI/HI/AVH  - Discussed with MD and CM - all in agreement with plan        Saifullah Jolley R. Sheppard Coil PMHNP-BC  03/17/2021  [place]
